# Patient Record
Sex: Male | Born: 1955 | Race: White | Hispanic: No
Health system: Midwestern US, Community
[De-identification: ages and names within clinical notes are randomized; demographics above are authoritative.]

## PROBLEM LIST (undated history)

## (undated) DIAGNOSIS — J449 Chronic obstructive pulmonary disease, unspecified: Principal | ICD-10-CM

## (undated) DIAGNOSIS — J432 Centrilobular emphysema: Principal | ICD-10-CM

## (undated) DIAGNOSIS — J441 Chronic obstructive pulmonary disease with (acute) exacerbation: Principal | ICD-10-CM

---

## 2010-05-19 LAB — CBC WITH AUTO DIFFERENTIAL
Absolute Baso #: 0 E9/L (ref 0.00–0.20)
Absolute Eos #: 0 E9/L — ABNORMAL LOW (ref 0.05–0.50)
Absolute Lymph #: 1.33 E9/L — ABNORMAL LOW (ref 1.50–4.00)
Absolute Mono #: 1.11 E9/L — ABNORMAL HIGH (ref 0.10–0.95)
Absolute Neut #: 19.76 E9/L — ABNORMAL HIGH (ref 1.80–7.30)
Band Neutrophils: 10 % (ref 0–12)
Basophils: 0 % (ref 0–2)
Eosinophils %: 0 % (ref 0–6)
HCT: 42.5 % (ref 37.0–54.0)
HGB: 14.2 g/dL (ref 12.5–16.5)
Lymphocytes: 6 % — ABNORMAL LOW (ref 20–42)
MCH: 33.6 pg (ref 26.0–35.0)
MCHC: 33.3 % (ref 32.0–34.5)
MCV: 100.8 fL — ABNORMAL HIGH (ref 80.0–99.9)
MPV: 8.1 fL (ref 7.0–12.0)
Monocytes: 5 % (ref 2–12)
Platelet Estimate: NORMAL
Platelets: 296 E9/L (ref 130–450)
RBC: 4.22 E12/L (ref 3.80–5.80)
RDW: 11.4 fL — ABNORMAL LOW (ref 11.5–15.0)
Seg Neutrophils: 79 % (ref 43–80)
WBC: 22.2 E9/L — ABNORMAL HIGH (ref 4.5–11.5)

## 2010-05-19 LAB — BASIC METABOLIC PANEL
BUN: 18 mg/dL (ref 6–20)
CO2: 35 mmol/L — ABNORMAL HIGH (ref 20–31)
Calcium: 8.8 mg/dL (ref 8.6–10.5)
Chloride: 99 mmol/L (ref 99–109)
Creatinine: 0.8 mg/dL (ref 0.7–1.3)
Glucose: 122 mg/dL — ABNORMAL HIGH (ref 70–110)
Potassium: 4.1 mmol/L (ref 3.5–5.5)
Sodium: 139 mmol/L (ref 132–146)

## 2010-05-19 LAB — GFR CALCULATED: Gfr Calculated: >60 mL/min/{1.73_m2} (ref >=60)

## 2010-05-20 LAB — CK-MB
CK-MB: 2.1 ng/mL (ref 0.0–5.0)
CK-MB: 0.9 ng/mL (ref 0.0–5.0)

## 2010-05-20 LAB — RESPIRATORY ABG RESULTS ONLY
AaDO2: 102.3 mmHg
B.E.: 1.9 mmol/L (ref <=3.0)
B.E.: 5.1 mmol/L — ABNORMAL HIGH (ref <=3.0)
B.E.: 4.2 mmol/L — ABNORMAL HIGH (ref <=3.0)
COHb: 1.5 % (ref 0.0–1.5)
COHb: 1.6 % — ABNORMAL HIGH (ref 0.0–1.5)
COHb: 1.5 % (ref 0.0–1.5)
Date Analyzed: 20110720
Date Analyzed: 20110720
Date Analyzed: 20110720
Date Of Collection: 20110720
Date Of Collection: 20110720
Date Of Collection: 20110720
FIO2: 100 %
FIO2: 50 %
HCO3: 31.4 mmol/L — ABNORMAL HIGH (ref 22.0–26.0)
HCO3: 31 mmol/L — ABNORMAL HIGH (ref 22.0–26.0)
HCO3: 31 mmol/L — ABNORMAL HIGH (ref 22.0–26.0)
HHb: 0.3 % (ref 0.0–5.0)
HHb: 0.3 % (ref 0.0–5.0)
HHb: 0.7 % (ref 0.0–5.0)
Lab: 10285
Lab: 10285
Lab: 10285
MetHb: 0.5 % (ref 0.0–1.5)
MetHb: 0.4 % (ref 0.0–1.5)
MetHb: 0.5 % (ref 0.0–1.5)
O2 Content: 19.3 mL/dL
O2 Content: 20 mL/dL
O2Hb: 97.7 % — ABNORMAL HIGH (ref 94.0–97.0)
O2Hb: 97.7 % — ABNORMAL HIGH (ref 94.0–97.0)
O2Hb: 97.3 % — ABNORMAL HIGH (ref 94.0–97.0)
Operator ID: 9
Operator ID: 9
Operator ID: 274
PCO2: 72.8 mmHg (ref 35.0–45.0)
PCO2: 50.5 mmHg — ABNORMAL HIGH (ref 35.0–45.0)
PCO2: 54.8 mmHg — ABNORMAL HIGH (ref 35.0–45.0)
PO2/FIO2: 3.75 mmHg/%
PO2: >550 mmHg (ref 60.0–100.0)
PO2: 187.4 mmHg — ABNORMAL HIGH (ref 60.0–100.0)
PO2: 134.8 mmHg — ABNORMAL HIGH (ref 60.0–100.0)
Peep/Cpap: 5 cmH2O
Peep/Cpap: 5 cmH2O
Peep/Cpap: 5 cmH2O
Pt Temp: 37 C
Pt Temp: 37 C
Pt Temp: 37 C
RI(T): 0.55
Rr Mechanical: 16 b/min
Rr Mechanical: 20 b/min
Rr Mechanical: 20 b/min
Time Analyzed: 304
Time Analyzed: 516
Time Analyzed: 1652
Time Collected: 300
Time Collected: 515
Time Collected: 1650
Vt Mechanical: 600 mL
Vt Mechanical: 600 mL
Vt Mechanical: 600 mL
pH: 7.252 — ABNORMAL LOW (ref 7.350–7.450)
pH: 7.406 (ref 7.350–7.450)
pH: 7.37 (ref 7.350–7.450)
sO2: 99.7 % — ABNORMAL HIGH (ref 92.0–98.5)
sO2: 99.7 % — ABNORMAL HIGH (ref 92.0–98.5)
sO2: 99.3 % — ABNORMAL HIGH (ref 92.0–98.5)
tHb (est): 14.5 g/dL (ref 11.5–16.5)
tHb (est): 13.8 g/dL (ref 11.5–16.5)
tHb (est): 14.5 g/dL (ref 11.5–16.5)

## 2010-05-20 LAB — BLOOD GAS, ARTERIAL
B.E.: 6 mmol/L — ABNORMAL HIGH (ref <=3.0)
B.E.: 1.7 mmol/L (ref <=3.0)
HCO3: 32.8 mmol/L — ABNORMAL HIGH (ref 22.0–26.0)
HCO3: 33.1 mmol/L — ABNORMAL HIGH (ref 22.0–26.0)
Inspiratory Hold: 0 s
Inspired O2: 3 %
Inspired O2: 7 %
O2 Sat: 91 % — ABNORMAL LOW (ref 95.0–100.0)
O2 Sat: 98.8 % (ref 95.0–100.0)
PCO2: 56.5 mmHg — ABNORMAL HIGH (ref 35.0–45.0)
PCO2: 88.6 mmHg (ref 35.0–45.0)
PO2: 63 mmHg (ref 60.0–80.0)
PO2: 138.7 mmHg — ABNORMAL HIGH (ref 60.0–80.0)
Paw: 0 cmH2O
Peep/Cpap: 0 cmH2O
Pressure Support: 0 cmH2O
Rr Mechanical: 0 {beats}/min
Vt Mechanical: 0 ml
pH, Arterial: 7.372 (ref 7.350–7.450)
pH, Arterial: 7.19 (ref 7.350–7.450)

## 2010-05-20 LAB — COMPREHENSIVE METABOLIC PANEL
ALT: 37 U/L (ref 10–49)
AST: 26 U/L (ref 0–33)
Albumin: 4.4 g/dL (ref 3.2–4.8)
Alkaline Phosphatase: 77 U/L (ref 45–129)
BUN: 16 mg/dL (ref 6–20)
Bilirubin, Total: 0.3 mg/dL (ref 0.3–1.2)
CO2: 31 mmol/L (ref 20–31)
Calcium: 9 mg/dL (ref 8.6–10.5)
Chloride: 100 mmol/L (ref 99–109)
Creatinine: 0.9 mg/dL (ref 0.7–1.3)
Glucose: 182 mg/dL — ABNORMAL HIGH (ref 70–110)
Potassium: 6 mmol/L — ABNORMAL HIGH (ref 3.5–5.5)
Sodium: 135 mmol/L (ref 132–146)
Total Protein: 7.3 g/dL (ref 5.7–8.2)

## 2010-05-20 LAB — TSH: TSH: 0.379 u[IU]/mL (ref 0.350–5.000)

## 2010-05-20 LAB — LIPID PANEL
Cholesterol: 153 mg/dL (ref 0–199)
HDL: 40 mg/dL — AB (ref >40.0)
LDL Calculated: 92 mg/dL (ref 0–99)
Triglycerides: 105 mg/dL (ref 0–149)

## 2010-05-20 LAB — CBC WITH AUTO DIFFERENTIAL
Absolute Baso #: 0 E9/L (ref 0.00–0.20)
Absolute Eos #: 0 E9/L — ABNORMAL LOW (ref 0.05–0.50)
Absolute Lymph #: 0.34 E9/L — ABNORMAL LOW (ref 1.50–4.00)
Absolute Mono #: 0.17 E9/L (ref 0.10–0.95)
Absolute Neut #: 11.39 E9/L — ABNORMAL HIGH (ref 1.80–7.30)
Basophils: 0 % (ref 0–2)
Eosinophils %: 0 % (ref 0–6)
HCT: 36.8 % — ABNORMAL LOW (ref 37.0–54.0)
HGB: 13.1 g/dL (ref 12.5–16.5)
Lymphocytes: 3 % — ABNORMAL LOW (ref 20–42)
MCH: 34.4 pg (ref 26.0–35.0)
MCHC: 35.5 % — ABNORMAL HIGH (ref 32.0–34.5)
MCV: 97 fL (ref 80.0–99.9)
MPV: 8.9 fL (ref 7.0–12.0)
Monocytes: 2 % (ref 2–12)
Platelet Estimate: NORMAL
Platelets: 242 E9/L (ref 130–450)
RBC: 3.8 E12/L (ref 3.80–5.80)
RDW: 13.6 fL (ref 11.5–15.0)
Seg Neutrophils: 96 % — ABNORMAL HIGH (ref 43–80)
WBC: 11.9 E9/L — ABNORMAL HIGH (ref 4.5–11.5)

## 2010-05-20 LAB — MULTI DRUG SCREEN, URINE
Amphetamines: POSITIVE ng/mL
Barbiturates: NOT DETECTED ng/mL (ref ?–200)
Benzodiazepines: POSITIVE ng/mL (ref ?–200)
Cannabinoids: NOT DETECTED ng/mL
Cocaine Metabolite: NOT DETECTED ng/mL (ref ?–300)
Methadone: NOT DETECTED ng/mL (ref ?–300)
Opiates: NOT DETECTED ng/mL (ref ?–300)
Phencyclidine: NOT DETECTED ng/mL
Propoxyphene: NOT DETECTED ng/mL (ref ?–300)

## 2010-05-20 LAB — MAGNESIUM: Magnesium: 2.1 mg/dL (ref 1.3–2.7)

## 2010-05-20 LAB — TROPONIN
Troponin I: 0.09 ng/mL (ref 0.00–0.40)
Troponin I: 0.03 ng/mL (ref 0.00–0.40)

## 2010-05-20 LAB — CK
Total CK: 126 U/L (ref 32–294)
Total CK: 78 U/L (ref 32–294)

## 2010-05-20 LAB — T3, UPTAKE: T3 Uptake: 40.5 % — ABNORMAL HIGH (ref 22.5–37.0)

## 2010-05-20 LAB — GFR CALCULATED: Gfr Calculated: >60 mL/min/{1.73_m2} (ref >=60)

## 2010-05-20 LAB — PHOSPHORUS: Phosphorus: 2.2 mg/dL — ABNORMAL LOW (ref 2.4–5.1)

## 2010-05-20 LAB — T4: T4, Total: 8 ug/dL (ref 4.5–10.9)

## 2010-05-21 LAB — CBC WITH AUTO DIFFERENTIAL
Absolute Baso #: 0 E9/L (ref 0.00–0.20)
Absolute Eos #: 0 E9/L — ABNORMAL LOW (ref 0.05–0.50)
Absolute Lymph #: 0.77 E9/L — ABNORMAL LOW (ref 1.50–4.00)
Absolute Mono #: 0.85 E9/L (ref 0.10–0.95)
Absolute Neut #: 8.28 E9/L — ABNORMAL HIGH (ref 1.80–7.30)
Basophils: 0 % (ref 0–2)
Eosinophils %: 0 % (ref 0–6)
HCT: 33.7 % — ABNORMAL LOW (ref 37.0–54.0)
HGB: 11.8 g/dL — ABNORMAL LOW (ref 12.5–16.5)
Lymphocytes: 8 % — ABNORMAL LOW (ref 20–42)
MCH: 34.4 pg (ref 26.0–35.0)
MCHC: 35.1 % — ABNORMAL HIGH (ref 32.0–34.5)
MCV: 97.9 fL (ref 80.0–99.9)
MPV: 8.9 fL (ref 7.0–12.0)
Monocytes: 9 % (ref 2–12)
Platelets: 275 E9/L (ref 130–450)
RBC: 3.44 E12/L — ABNORMAL LOW (ref 3.80–5.80)
RDW: 13.7 fL (ref 11.5–15.0)
Seg Neutrophils: 84 % — ABNORMAL HIGH (ref 43–80)
WBC: 9.9 E9/L (ref 4.5–11.5)

## 2010-05-21 LAB — RESPIRATORY ABG RESULTS ONLY
AaDO2: 75.3 mmHg
AaDO2: 77.4 mmHg
B.E.: 4.5 mmol/L — ABNORMAL HIGH (ref <=3.0)
B.E.: 6.7 mmol/L — ABNORMAL HIGH (ref <=3.0)
B.E.: 6.8 mmol/L — ABNORMAL HIGH (ref <=3.0)
COHb: 1.1 % (ref 0.0–1.5)
COHb: 1.1 % (ref 0.0–1.5)
COHb: 1.5 % (ref 0.0–1.5)
Date Analyzed: 20110721
Date Analyzed: 20110721
Date Analyzed: 20110721
Date Of Collection: 20110721
Date Of Collection: 20110721
Date Of Collection: 20110721
FIO2: 40 %
FIO2: 40 %
HCO3: 30.9 mmol/L — ABNORMAL HIGH (ref 22.0–26.0)
HCO3: 33.6 mmol/L — ABNORMAL HIGH (ref 22.0–26.0)
HCO3: 34.4 mmol/L — ABNORMAL HIGH (ref 22.0–26.0)
HHb: 0.8 % (ref 0.0–5.0)
HHb: 1 % (ref 0.0–5.0)
HHb: 3.9 % (ref 0.0–5.0)
Lab: 10285
Lab: 10285
Lab: 10285
MetHb: 0.5 % (ref 0.0–1.5)
MetHb: 0.5 % (ref 0.0–1.5)
MetHb: 0.5 % (ref 0.0–1.5)
O2 Content: 17.5 mL/dL
O2 Content: 17.8 mL/dL
O2 Content: 18.3 mL/dL
O2Hb: 94.1 % (ref 94.0–97.0)
O2Hb: 97.4 % — ABNORMAL HIGH (ref 94.0–97.0)
O2Hb: 97.6 % — ABNORMAL HIGH (ref 94.0–97.0)
Operator ID: 33
Operator ID: 9
Operator ID: 9097
PCO2: 54.5 mmHg — ABNORMAL HIGH (ref 35.0–45.0)
PCO2: 58.7 mmHg — ABNORMAL HIGH (ref 35.0–45.0)
PCO2: 62.8 mmHg — ABNORMAL HIGH (ref 35.0–45.0)
PO2/FIO2: 3.48 mmHg/%
PO2/FIO2: 3.31 mmHg/%
PO2: 132.3 mmHg — ABNORMAL HIGH (ref 60.0–100.0)
PO2: 139.2 mmHg — ABNORMAL HIGH (ref 60.0–100.0)
PO2: 78.7 mmHg (ref 60.0–100.0)
Peep/Cpap: 5 cmH2O
Peep/Cpap: 15 cmH2O
Pt Temp: 37 C
Pt Temp: 37 C
Pt Temp: 37 C
RI(T): 0.54
RI(T): 0.59
Rr Mechanical: 20 b/min
Time Analyzed: 1944
Time Analyzed: 521
Time Analyzed: 830
Time Collected: 1925
Time Collected: 515
Time Collected: 825
Vt Mechanical: 600 mL
pH: 7.356 (ref 7.350–7.450)
pH: 7.372 (ref 7.350–7.450)
pH: 7.376 (ref 7.350–7.450)
sO2: 96 % (ref 92.0–98.5)
sO2: 99 % — ABNORMAL HIGH (ref 92.0–98.5)
sO2: 99.2 % — ABNORMAL HIGH (ref 92.0–98.5)
tHb (est): 12.6 g/dL (ref 11.5–16.5)
tHb (est): 13.2 g/dL (ref 11.5–16.5)
tHb (est): 13.4 g/dL (ref 11.5–16.5)

## 2010-05-21 LAB — COMPREHENSIVE METABOLIC PANEL
ALT: 26 U/L (ref 10–49)
AST: 15 U/L (ref 0–33)
Albumin: 4 g/dL (ref 3.2–4.8)
Alkaline Phosphatase: 65 U/L (ref 45–129)
BUN: 23 mg/dL — ABNORMAL HIGH (ref 6–20)
Bilirubin, Total: 0.2 mg/dL — ABNORMAL LOW (ref 0.3–1.2)
CO2: 33 mmol/L — ABNORMAL HIGH (ref 20–31)
Calcium: 9.3 mg/dL (ref 8.6–10.5)
Chloride: 99 mmol/L (ref 99–109)
Creatinine: 0.7 mg/dL (ref 0.7–1.3)
Glucose: 175 mg/dL — ABNORMAL HIGH (ref 70–110)
Potassium: 4.7 mmol/L (ref 3.5–5.5)
Sodium: 135 mmol/L (ref 132–146)
Total Protein: 6.7 g/dL (ref 5.7–8.2)

## 2010-05-21 LAB — TLC CONFIRMATION

## 2010-05-21 LAB — CK-MB: CK-MB: <0.3 ng/mL (ref 0.0–5.0)

## 2010-05-21 LAB — TROPONIN: Troponin I: 0.02 ng/mL (ref 0.00–0.40)

## 2010-05-21 LAB — BENZODIAZEPINES CONFIRMATION URINE REFLEX

## 2010-05-21 LAB — CK: Total CK: 43 U/L (ref 32–294)

## 2010-05-21 LAB — GFR CALCULATED: Gfr Calculated: >60 mL/min/{1.73_m2} (ref >=60)

## 2010-05-22 LAB — CBC WITH AUTO DIFFERENTIAL
Absolute Baso #: 0 E9/L (ref 0.00–0.20)
Absolute Eos #: 0.14 E9/L (ref 0.05–0.50)
Absolute Lymph #: 0.74 E9/L — ABNORMAL LOW (ref 1.50–4.00)
Absolute Mono #: 0.46 E9/L (ref 0.10–0.95)
Absolute Neut #: 13.88 E9/L — ABNORMAL HIGH (ref 1.80–7.30)
Basophils: 0 % (ref 0–2)
Eosinophils %: 1 % (ref 0–6)
HCT: 36.1 % — ABNORMAL LOW (ref 37.0–54.0)
HGB: 12.6 g/dL (ref 12.5–16.5)
Lymphocytes: 5 % — ABNORMAL LOW (ref 20–42)
MCH: 34.1 pg (ref 26.0–35.0)
MCHC: 34.8 % — ABNORMAL HIGH (ref 32.0–34.5)
MCV: 98 fL (ref 80.0–99.9)
MPV: 8.7 fL (ref 7.0–12.0)
Monocytes: 3 % (ref 2–12)
Platelets: 317 E9/L (ref 130–450)
RBC: 3.68 E12/L — ABNORMAL LOW (ref 3.80–5.80)
RDW: 13.3 fL (ref 11.5–15.0)
Seg Neutrophils: 91 % — ABNORMAL HIGH (ref 43–80)
WBC: 15.2 E9/L — ABNORMAL HIGH (ref 4.5–11.5)

## 2010-05-22 LAB — COMPREHENSIVE METABOLIC PANEL
ALT: 90 U/L — ABNORMAL HIGH (ref 10–49)
AST: 62 U/L — ABNORMAL HIGH (ref 0–33)
Albumin: 4.1 g/dL (ref 3.2–4.8)
Alkaline Phosphatase: 66 U/L (ref 45–129)
BUN: 18 mg/dL (ref 6–20)
Bilirubin, Total: 0.4 mg/dL (ref 0.3–1.2)
CO2: 35 mmol/L — ABNORMAL HIGH (ref 20–31)
Calcium: 9 mg/dL (ref 8.6–10.5)
Chloride: 102 mmol/L (ref 99–109)
Creatinine: 0.5 mg/dL — ABNORMAL LOW (ref 0.7–1.3)
Glucose: 147 mg/dL — ABNORMAL HIGH (ref 70–110)
Potassium: 5.2 mmol/L (ref 3.5–5.5)
Sodium: 140 mmol/L (ref 132–146)
Total Protein: 6.6 g/dL (ref 5.7–8.2)

## 2010-05-22 LAB — GFR CALCULATED: Gfr Calculated: >60 mL/min/{1.73_m2} (ref >=60)

## 2010-05-22 LAB — HEMOGLOBIN A1C: Hemoglobin A1C: 3.9 % — ABNORMAL LOW (ref 4.8–6.0)

## 2010-05-23 LAB — CBC WITH AUTO DIFFERENTIAL
Absolute Baso #: 0.01 E9/L (ref 0.00–0.20)
Absolute Eos #: 0 E9/L — ABNORMAL LOW (ref 0.05–0.50)
Absolute Lymph #: 1.49 E9/L — ABNORMAL LOW (ref 1.50–4.00)
Absolute Mono #: 0.94 E9/L (ref 0.10–0.95)
Absolute Neut #: 11.42 E9/L — ABNORMAL HIGH (ref 1.80–7.30)
Basophils: 0 % (ref 0–2)
Eosinophils %: 0 % (ref 0–6)
HCT: 37.6 % (ref 37.0–54.0)
HGB: 13 g/dL (ref 12.5–16.5)
Lymphocytes: 11 % — ABNORMAL LOW (ref 20–42)
MCH: 33.8 pg (ref 26.0–35.0)
MCHC: 34.5 % (ref 32.0–34.5)
MCV: 97.7 fL (ref 80.0–99.9)
MPV: 8.2 fL (ref 7.0–12.0)
Monocytes: 7 % (ref 2–12)
Platelets: 348 E9/L (ref 130–450)
RBC: 3.85 E12/L (ref 3.80–5.80)
RDW: 13.5 fL (ref 11.5–15.0)
Seg Neutrophils: 82 % — ABNORMAL HIGH (ref 43–80)
WBC: 13.9 E9/L — ABNORMAL HIGH (ref 4.5–11.5)

## 2010-05-23 LAB — RESPIRATORY ABG RESULTS ONLY
B.E.: 9.4 mmol/L — ABNORMAL HIGH (ref <=3.0)
COHb: 2 % — ABNORMAL HIGH (ref 0.0–1.5)
Date Analyzed: 20110723
Date Of Collection: 20110723
HCO3: 34.7 mmol/L — ABNORMAL HIGH (ref 22.0–26.0)
HHb: 5.7 % — ABNORMAL HIGH (ref 0.0–5.0)
Lab: 10285
MetHb: 0.4 % (ref 0.0–1.5)
O2 Content: 19.1 mL/dL
O2Hb: 91.9 % — ABNORMAL LOW (ref 94.0–97.0)
Operator ID: 40
PCO2: 49.1 mmHg — ABNORMAL HIGH (ref 35.0–45.0)
PO2: 61.6 mmHg (ref 60.0–100.0)
Pt Temp: 37 C
Time Analyzed: 1326
Time Collected: 1320
pH: 7.467 — ABNORMAL HIGH (ref 7.350–7.450)
sO2: 94.2 % (ref 92.0–98.5)
tHb (est): 14.8 g/dL (ref 11.5–16.5)

## 2010-05-23 LAB — HEPATIC FUNCTION PANEL
ALT: 76 U/L — ABNORMAL HIGH (ref 10–49)
AST: 22 U/L (ref 0–33)
Albumin: 3.9 g/dL (ref 3.2–4.8)
Alkaline Phosphatase: 63 U/L (ref 45–129)
Bilirubin, Direct: 0.2 mg/dL (ref 0.0–0.2)
Bilirubin, Total: 0.6 mg/dL (ref 0.3–1.2)
Total Protein: 6.2 g/dL (ref 5.7–8.2)

## 2010-05-23 LAB — HEMOGLOBIN A1C: Hemoglobin A1C: 3.9 % — ABNORMAL LOW (ref 4.8–6.0)

## 2010-05-23 LAB — COMPREHENSIVE METABOLIC PANEL
BUN: 17 mg/dL (ref 6–20)
CO2: 37 mmol/L — ABNORMAL HIGH (ref 20–31)
Calcium: 9.3 mg/dL (ref 8.6–10.5)
Chloride: 99 mmol/L (ref 99–109)
Creatinine: 0.7 mg/dL (ref 0.7–1.3)
Glucose: 119 mg/dL — ABNORMAL HIGH (ref 70–110)
Potassium: 5.2 mmol/L (ref 3.5–5.5)
Sodium: 139 mmol/L (ref 132–146)

## 2010-05-23 LAB — GFR CALCULATED: Gfr Calculated: >60 mL/min/{1.73_m2} (ref >=60)

## 2010-05-24 LAB — THEOPHYLLINE LEVEL: Theophylline Lvl: 3.3 ug/mL — ABNORMAL LOW (ref 10.0–20.0)

## 2010-05-25 LAB — HEPATITIS PANEL, ACUTE
Hep A IgM: NONREACTIVE
Hep B Core Ab, IgM: NONREACTIVE
Hepatitis B Surface Ag: NONREACTIVE
Hepatitis C Ab: NONREACTIVE

## 2011-06-07 ENCOUNTER — Inpatient Hospital Stay: Admit: 2011-06-07 | Discharge: 2011-06-08 | Disposition: A | Discharge status: Other Acute Inpatient Hospital

## 2011-06-08 LAB — CK-MB: CK-MB: 3.4 ng/mL (ref 0.0–5.0)

## 2011-06-08 LAB — CBC WITH AUTO DIFFERENTIAL
Absolute Eos #: 0.1 E9/L (ref 0.05–0.50)
Absolute Lymph #: 4.3 E9/L — ABNORMAL HIGH (ref 1.50–4.00)
Absolute Mono #: 1.1 E9/L — ABNORMAL HIGH (ref 0.10–0.95)
Absolute Neut #: 10.7 E9/L — ABNORMAL HIGH (ref 1.80–7.30)
Basophils Absolute: 0.1 E9/L (ref 0.00–0.20)
Basophils: 0 % (ref 0–2)
Eosinophils %: 1 % (ref 0–6)
Hematocrit: 48.4 % (ref 37.0–54.0)
Hemoglobin: 17 g/dL — ABNORMAL HIGH (ref 12.5–16.5)
Lymphocytes: 27 % (ref 20–42)
MCH: 34.6 pg (ref 26.0–35.0)
MCHC: 35.2 % — ABNORMAL HIGH (ref 32.0–34.5)
MCV: 98.4 fL (ref 80.0–99.9)
MPV: 8.5 fL (ref 7.0–12.0)
Monocytes: 7 % (ref 2–12)
Platelets: 318 E9/L (ref 130–450)
RBC: 4.91 E12/L (ref 3.80–5.80)
RDW: 13.5 fL (ref 11.5–15.0)
Seg Neutrophils: 65 % (ref 43–80)
WBC: 16.3 E9/L — ABNORMAL HIGH (ref 4.5–11.5)

## 2011-06-08 LAB — COMPREHENSIVE METABOLIC PANEL
ALT: 38 U/L (ref 10–49)
AST: 26 U/L (ref 0–33)
Albumin: 4.9 g/dL — ABNORMAL HIGH (ref 3.2–4.8)
Alkaline Phosphatase: 88 U/L (ref 45–129)
BUN: 10 mg/dL (ref 6–20)
CO2: 27 mmol/L (ref 20–31)
Calcium: 9.5 mg/dL (ref 8.6–10.5)
Chloride: 105 mmol/L (ref 99–109)
Creatinine: 0.8 mg/dL (ref 0.7–1.3)
Glucose: 116 mg/dL — ABNORMAL HIGH (ref 70–110)
Potassium: 4.3 mmol/L (ref 3.5–5.5)
Sodium: 141 mmol/L (ref 132–146)
Total Bilirubin: 0.6 mg/dL (ref 0.3–1.2)
Total Protein: 7.1 g/dL (ref 5.7–8.2)

## 2011-06-08 LAB — ETHANOL: Ethanol Lvl: 214 mg/dL

## 2011-06-08 LAB — TROPONIN: Troponin I: 0 ng/mL (ref 0.00–0.40)

## 2011-06-08 LAB — CK: Total CK: 113 U/L (ref 32–294)

## 2011-06-08 LAB — GFR CALCULATED: Gfr Calculated: >60 mL/min/{1.73_m2} (ref >=60)

## 2011-06-09 MED FILL — MORPHINE SULFATE 4 MG/ML IJ SOLN: 4 MG/ML | INTRAMUSCULAR | Qty: 1

## 2011-06-09 MED FILL — AMIDATE 2 MG/ML IV SOLN: 2 MG/ML | INTRAVENOUS | Qty: 10

## 2011-06-09 MED FILL — MIDAZOLAM HCL 2 MG/2ML IJ SOLN: 2 MG/ML | INTRAMUSCULAR | Qty: 2

## 2011-06-09 MED FILL — QUELICIN 20 MG/ML IJ SOLN: 20 MG/ML | INTRAMUSCULAR | Qty: 10

## 2011-06-09 MED FILL — SODIUM CHLORIDE 0.9 % IV SOLN: 0.9 % | INTRAVENOUS | Qty: 1000

## 2011-06-09 MED FILL — FENTANYL CITRATE 0.05 MG/ML IJ SOLN: 0.05 MG/ML | INTRAMUSCULAR | Qty: 2

## 2011-06-09 MED FILL — DIPRIVAN 10 MG/ML IV EMUL: 10 MG/ML | INTRAVENOUS | Qty: 50

## 2011-06-09 MED FILL — VECURONIUM BROMIDE 10 MG IV SOLR: 10 MG | INTRAVENOUS | Qty: 10

## 2012-04-09 NOTE — ED Provider Notes (Signed)
Patient is a 56 y.o. male presenting with pharyngitis. The history is provided by the patient [facility] in complaining of his throat closing, treated emergently with Solumedrol, Benadryl, and Pepcid.  Patient then reports a sudden onset of swelling on the right side of his neck and his throat hurts now).   Pharyngitis  This is a new (patient also complains of have bad reflux and belching today) problem. The current episode started less than 1 hour ago. The problem has been gradually worsening. Associated symptoms include abdominal pain (with reflux and belching). Pertinent negatives include no chest pain, no headaches and no shortness of breath.       Review of Systems   Constitutional: Negative for fever and chills.   HENT: Positive for sore throat and trouble swallowing. Negative for ear pain and sinus pressure.    Eyes: Negative for pain, discharge and redness.   Respiratory: Negative for cough, shortness of breath and wheezing.    Cardiovascular: Negative for chest pain.   Gastrointestinal: Positive for abdominal pain (with reflux and belching). Negative for nausea, vomiting and diarrhea.   Genitourinary: Negative for dysuria and frequency.   Musculoskeletal: Negative for back pain and arthralgias.   Skin: Negative for rash and wound.   Neurological: Negative for weakness and headaches.   Hematological: Negative for adenopathy.   All other systems reviewed and are negative.        Physical Exam   Nursing note and vitals reviewed.  Constitutional: He is oriented to person, place, and time. He appears well-developed and well-nourished.   HENT:   Head: Normocephalic and atraumatic.   Eyes: Pupils are equal, round, and reactive to light.   Neck: Normal range of motion. Neck supple.       Large tender mass palpated   Cardiovascular: Normal rate, regular rhythm and normal heart sounds.    No murmur heard.  Pulmonary/Chest: Effort normal and breath sounds normal. No respiratory distress. He has no wheezes. He has no  rales.   Abdominal: Soft. Bowel sounds are normal. There is no tenderness. There is no rebound and no guarding.   Musculoskeletal: He exhibits no edema.   Neurological: He is alert and oriented to person, place, and time. No cranial nerve deficit. Coordination normal.   Skin: Skin is warm and dry.       Procedures    MDM    Labs      Radiology      EKG Interpretation.    --------------------------------------------- PAST HISTORY ---------------------------------------------  Past Medical History:  has a past medical history of COPD (chronic obstructive pulmonary disease).    Past Surgical History:  has past surgical history that includes Appendectomy and Tonsillectomy.    Social History:  reports that he has quit smoking. He does not have any smokeless tobacco history on file. He reports that he does not drink alcohol or use illicit drugs.    Family History: family history is not on file.     The patient???s home medications have been reviewed.    Allergies: Bee    -------------------------------------------------- RESULTS -------------------------------------------------    LABS:  Results for orders placed during the hospital encounter of [id number]   BASIC METABOLIC PANEL       Result Value Range    Sodium 140  132 - 146 mmol/L    Potassium 3.6  3.5 - 5.5 mmol/L    Chloride 104  99 - 109 mmol/L    CO2 34 (*) 20 - 31 mmol/L  Glucose 111 (*) 70 - 110 mg/dL    BUN 14  6 - 20 mg/dL    Creatinine, Ser 0.8  0.7 - 1.3 mg/dL    Calcium 8.9  8.6 - 10.5 mg/dL   CBC WITH AUTO DIFFERENTIAL       Result Value Range    WBC 11.0  4.5 - 11.5 E9/L    RBC 4.02  3.80 - 5.80 E12/L    Hemoglobin 13.3  12.5 - 16.5 g/dL    Hematocrit 39.3  37.0 - 54.0 %    MCV 97.6  80.0 - 99.9 fL    MCH 33.1  26.0 - 35.0 pg    MCHC 33.9  32.0 - 34.5 %    RDW 13.7  11.5 - 15.0 fL    Platelets 267  130 - 450 E9/L    MPV 8.5  7.0 - 12.0 fL    Absolute Neut # 6.60  1.80 - 7.30 E9/L    Absolute Lymph # 2.90  1.50 - 4.00 E9/L    Absolute Mono # 1.20 (*) 0.10 -  0.95 E9/L    Absolute Eos # 0.20  0.05 - 0.50 E9/L    Basophils Absolute 0.10  0.00 - 0.20 E9/L    Seg Neutrophils 60  43 - 80 %    Lymphocytes 27  20 - 42 %    Monocytes 11  2 - 12 %    Eosinophils 2  0 - 6 %    Basophils 1  0 - 2 %   CK       Result Value Range    Total CK 45  32 - 294 U/L   CK-MB       Result Value Range    CK-MB 0.7  0.0 - 5.0 ng/mL   TROPONIN I       Result Value Range    Troponin I 0.00  0.00 - 0.04 ng/mL   GFR CALCULATED       Result Value Range    Gfr Calculated >60  >=60 ml/mn/1.73       RADIOLOGY:  CT SOFT TISSUE NECK W CONTRAST    Final Result:            EKG:  This EKG is signed and interpreted by me.    Rate: 79  Rhythm: Sinus  Interpretation: no acute changes  Comparison: no previous EKG available      ------------------------- NURSING NOTES AND VITALS REVIEWED ---------------------------   The nursing notes within the ED encounter and vital signs as below have been reviewed.   BP 149/95   Pulse 84   Temp(Src) 97.8 ??F (36.6 ??C) (Oral)   Resp 16   Ht 6' 2"$  (1.88 m)   Wt 200 lb (90.719 kg)   BMI 25.67 kg/m2   SpO2 99%  Oxygen Saturation Interpretation: Normal      ------------------------------------------ PROGRESS NOTES ------------------------------------------     Consultations:      Counseling:   I have spoken with the patient and discussed today???s results, in addition to providing specific details for the plan of care and counseling regarding the diagnosis and prognosis.  Their questions are answered at this time and they are agreeable with the plan.      --------------------------------- ADDITIONAL PROVIDER NOTES ---------------------------------     Discussed diagnosis.  Copy of CT scan report given to patient, as well as information printed from the internet.      This patient's ED course included: a  personal history and physicial eaxmination, re-evaluation prior to disposition, multiple bedside re-evaluations and IV medications    This patient has remained hemodynamically stable  and improved during their ED course.    Critical Care:         Isaias Cowman, DO  04/09/12 2331

## 2012-04-09 NOTE — Discharge Instructions (Signed)
Sialadenitis  Sialadenitis is an inflammation (soreness) of the salivary glands. The parotid is the main salivary gland. It lies behind the angle of the jaw below the ear. The saliva produced comes out of a tiny opening (duct) inside the cheek on either side. This is usually at the level of the upper back teeth. If it is swollen, the ear is pushed up and out. This helps tell this condition apart from a simple lymph gland infection (swollen glands) in the same area. Mumps has mostly disappeared since the start of immunization against mumps. Now the most common cause of parotitis is germ (bacterial) infection or inflammation of the lymphatics (the lymph channels). The other major salivary gland is located in the floor of the mouth. Smaller salivary glands are located in the mouth. This includes the:   Lips.   Lining of the mouth.   Pharynx.   Hard palate (front part of the roof of the mouth).  The salivary glands do many things, including:   Lubrication.   Breaking down food.   Production of hormones and antibodies (to protect against germs which may cause illness).   Help with the sense of taste.  ACUTE BACTERIAL SIALADENITIS  This is a sudden inflammatory response to bacterial infection. This causes redness, pain, swelling and tenderness over the infected gland. In the past, it was common in dehydrated and debilitated patients often following an operation. It is now more commonly seen:   After radiotherapy.   In patients with poor immune systems.  Treatment is:   The correction of fluid balance (rehydration).   Medicine that kill germs (antibiotics).   Pain relief.  CHRONIC RECURRENT SIALADENITIS  This refers to repeated episodes of discomfort and swelling of one of the salivary glands. It often occurs after eating. Chronic sialadenitis is usually less painful. It is associated with recurrent enlargement of a salivary gland, often following meals, and typically with an absence of redness. The  chronic form of the disease often is associated with conditions linked to decreased salivary flow, rather than dehydration (loss of body fluids). These conditions include:   A stone, or concretion, formed in the gallbladder, kidneys, or other parts of the body (calculi).   Salivary stasis.   A change in the fluid and electrolyte (the salts in your body fluids) makeup of the gland.  It is treated with:   Gland massage.   Methods to stimulate the flow of saliva, (for example, lemon juice).   Antibiotics if required.  Surgery to remove the gland is possible, but its benefits need to be balanced against risks.    VIRAL SIALADENITIS  Several viruses infect the salivary glands. Some of these include the mumps virus that commonly infects the parotid gland. Other viruses causing problems are:   The HIV virus.   Herpes.   Some of the influenza ("flu") viruses.  RECURRENT SIALADENITIS IN CHILDREN  This condition is thought to be due to swelling or ballooning of the ducts. It results in the same symptoms as acute bacterial parotitis. It is usually caused by germs (bacteria). It is often treated using penicillin. It may get well without treatment. Surgery is usually not required.  TUBERCULOUS SIALADENITIS  The salivary glands may become infected with the same bacteria causing tuberculosis ("TB"). Treatment is with anti-tuberculous antibiotic therapy.  OTHER UNCOMMON CAUSES OF SIALADENITIS     Sjogren's syndrome is a condition in which arthritis is associated with a decrease in activity of the glands of the body that  produce saliva and tears. The diagnosis is made with blood tests or by examination of a piece of tissue from the inside of the lip. Some people with this condition are bothered by:   A dry mouth.   Intermittent salivary gland enlargement.   Atypical mycobacteria is a germ similar to tuberculosis. It often infects children. It is often resistant to antibiotic treatment. It may require surgical treatment  to remove the infected salivary gland.   Actinomycosis is an infection of the parotid gland that may also involve the overlying skin. The diagnosis is made by detecting granules of sulphur produced by the bacteria on microscopic examination. Treatment is a prolonged course of penicillin for up to one year.   Nutritional causes include vitamin deficiencies and bulimia.   Diabetes and problems with your thyroid.   Obesity, cirrhosis, and malabsorption are some metabolic causes.  HOME CARE INSTRUCTIONS     Apply ice bags every 2 hours for 15 to 20 minutes, while awake, to the sore gland for 24 hours, then as directed by your caregiver. Place the ice in a plastic bag with a towel around it to prevent frostbite to the skin.   Only take over-the-counter or prescription medicines for pain, discomfort, or fever as directed by your caregiver.  SEEK IMMEDIATE MEDICAL CARE IF:     There is increased pain or swelling in your gland that is not controlled with medicine.   An oral temperature above 102 F (38.9 C) develops, not controlled by medicine.   You develop difficulty opening your mouth, swallowing, or speaking.  Document Released: 04/09/2002 Document Revised: 10/07/2011 Document Reviewed: 06/03/2008  [facility] Patient Information 2012 [employer].

## 2012-04-09 NOTE — ED Notes (Signed)
"  feeling better", monitoring ongoing.    Rodena Goldmann, RN  04/09/12 2239

## 2012-04-09 NOTE — ED Notes (Signed)
Return from CT.    Rodena Goldmann, RN  04/09/12 [phone]

## 2012-04-09 NOTE — ED Notes (Signed)
Stated "throat was feeling like it was swelling shut" unknown exposure or trigger.  Speech clear, unlabored.  "hurts to swallow".    Rodena Goldmann, RN  04/09/12 2214

## 2012-04-09 NOTE — ED Notes (Signed)
Dr speaking with pt.    Rodena Goldmann, RN  04/09/12 2256

## 2012-04-10 ENCOUNTER — Inpatient Hospital Stay
Admit: 2012-04-10 | Discharge: 2012-04-10 | Disposition: A | Discharge status: Home / Self Care | Attending: Emergency Medicine

## 2012-04-10 LAB — CBC WITH AUTO DIFFERENTIAL
Absolute Eos #: 0.2 E9/L (ref 0.05–0.50)
Absolute Lymph #: 2.9 E9/L (ref 1.50–4.00)
Absolute Mono #: 1.2 E9/L — ABNORMAL HIGH (ref 0.10–0.95)
Absolute Neut #: 6.6 E9/L (ref 1.80–7.30)
Basophils Absolute: 0.1 E9/L (ref 0.00–0.20)
Basophils: 1 % (ref 0–2)
Eosinophils %: 2 % (ref 0–6)
Hematocrit: 39.3 % (ref 37.0–54.0)
Hemoglobin: 13.3 g/dL (ref 12.5–16.5)
Lymphocytes: 27 % (ref 20–42)
MCH: 33.1 pg (ref 26.0–35.0)
MCHC: 33.9 % (ref 32.0–34.5)
MCV: 97.6 fL (ref 80.0–99.9)
MPV: 8.5 fL (ref 7.0–12.0)
Monocytes: 11 % (ref 2–12)
Platelets: 267 E9/L (ref 130–450)
RBC: 4.02 E12/L (ref 3.80–5.80)
RDW: 13.7 fL (ref 11.5–15.0)
Seg Neutrophils: 60 % (ref 43–80)
WBC: 11 E9/L (ref 4.5–11.5)

## 2012-04-10 LAB — BASIC METABOLIC PANEL
BUN: 14 mg/dL (ref 6–20)
CO2: 34 mmol/L — ABNORMAL HIGH (ref 20–31)
Calcium: 8.9 mg/dL (ref 8.6–10.5)
Chloride: 104 mmol/L (ref 99–109)
Creatinine: 0.8 mg/dL (ref 0.7–1.3)
Glucose: 111 mg/dL — ABNORMAL HIGH (ref 70–110)
Potassium: 3.6 mmol/L (ref 3.5–5.5)
Sodium: 140 mmol/L (ref 132–146)

## 2012-04-10 LAB — TROPONIN: Troponin I: 0 ng/mL (ref 0.00–0.04)

## 2012-04-10 LAB — GFR CALCULATED: Gfr Calculated: >60 mL/min/{1.73_m2} (ref >=60)

## 2012-04-10 LAB — CK: Total CK: 45 U/L (ref 32–294)

## 2012-04-10 LAB — CK-MB: CK-MB: 0.7 ng/mL (ref 0.0–5.0)

## 2012-04-10 MED ORDER — CLINDAMYCIN HCL 300 MG PO CAPS
300 MG | ORAL_CAPSULE | Freq: Four times a day (QID) | ORAL | Status: AC
Start: 2012-04-10 — End: 2012-04-19

## 2012-04-10 MED ORDER — NAPROXEN 500 MG PO TABS
500 MG | ORAL_TABLET | Freq: Three times a day (TID) | ORAL | Status: DC
Start: 2012-04-10 — End: 2013-09-13

## 2012-04-10 MED ORDER — HYDROCODONE-ACETAMINOPHEN 5-325 MG PO TABS
5-325 MG | ORAL_TABLET | Freq: Four times a day (QID) | ORAL | Status: AC | PRN
Start: 2012-04-10 — End: 2012-04-16

## 2012-04-10 MED ORDER — DIPHENHYDRAMINE HCL 50 MG/ML IJ SOLN
50 MG/ML | Freq: Once | INTRAMUSCULAR | Status: AC
Start: 2012-04-10 — End: 2012-04-09

## 2012-04-10 MED ADMIN — ketorolac (TORADOL) injection 30 mg: INTRAVENOUS | @ 03:00:00 | NDC 64679075801

## 2012-04-10 MED ADMIN — Morphine Sulfate (PF) injection 2 mg: INTRAVENOUS | @ 03:00:00 | NDC 00409189001

## 2012-04-10 MED ADMIN — diphenhydrAMINE (BENADRYL) 50 MG/ML injection: INTRAVENOUS | @ 02:00:00 | NDC 00641037621

## 2012-04-10 MED ADMIN — ioversol (OPTIRAY) 68 % injection 100 mL: INTRAVENOUS | @ 02:00:00 | NDC 00019132311

## 2012-04-10 MED ADMIN — methylPREDNISolone sodium (SOLU-MEDROL) 125 MG injection: @ 02:00:00 | NDC 00009004725

## 2012-04-10 MED ADMIN — clindamycin (CLEOCIN) 600 mg in dextrose 5 % 50 mL IVPB: INTRAVENOUS | @ 03:00:00 | NDC 00009337501

## 2012-04-10 MED ADMIN — famotidine (PEPCID) 10 MG/ML injection: @ 02:00:00 | NDC 00069012101

## 2012-04-10 MED FILL — DIPHENHYDRAMINE HCL 50 MG/ML IJ SOLN: 50 MG/ML | INTRAMUSCULAR | Qty: 1

## 2012-04-10 MED FILL — METHYLPREDNISOLONE SODIUM SUCC 125 MG IJ SOLR: 125 MG | INTRAMUSCULAR | Qty: 125

## 2012-04-10 MED FILL — CLEOCIN IN D5W 600 MG/50ML IV SOLN: 600 MG/50ML | INTRAVENOUS | Qty: 50

## 2012-04-10 MED FILL — FAMOTIDINE 10 MG/ML IV SOLN: 10 MG/ML | INTRAVENOUS | Qty: 2

## 2012-04-10 MED FILL — MORPHINE SULFATE (PF) 2 MG/ML IV SOLN: 2 mg/mL | INTRAVENOUS | Qty: 1

## 2012-04-10 MED FILL — KETOROLAC TROMETHAMINE 30 MG/ML IJ SOLN: 30 MG/ML | INTRAMUSCULAR | Qty: 1

## 2012-05-01 LAB — EKG 12-LEAD

## 2013-09-09 ENCOUNTER — Inpatient Hospital Stay: Admit: 2013-09-09 | Source: Home / Self Care | Admitting: Internal Medicine

## 2013-09-09 LAB — CBC WITH AUTO DIFFERENTIAL
Basophils %: 0 % (ref 0–2)
Basophils Absolute: 0 E9/L (ref 0.00–0.20)
Eosinophils %: 0 % (ref 0–6)
Eosinophils Absolute: 0 E9/L — ABNORMAL LOW (ref 0.05–0.50)
Hematocrit: 46.2 % (ref 37.0–54.0)
Hemoglobin: 15.8 g/dL (ref 12.5–16.5)
Lymphocytes %: 10 % — ABNORMAL LOW (ref 20–42)
Lymphocytes Absolute: 2.65 E9/L (ref 1.50–4.00)
MCH: 33.1 pg (ref 26.0–35.0)
MCHC: 34.2 % (ref 32.0–34.5)
MCV: 96.6 fL (ref 80.0–99.9)
MPV: 8.6 fL (ref 7.0–12.0)
Monocytes %: 3 % (ref 2–12)
Monocytes Absolute: 0.8 E9/L (ref 0.10–0.95)
Neutrophils %: 87 % — ABNORMAL HIGH (ref 43–80)
Neutrophils Absolute: 23.06 E9/L — ABNORMAL HIGH (ref 1.80–7.30)
PLATELET SLIDE REVIEW: NORMAL
Platelets: 295 E9/L (ref 130–450)
RBC Morphology: NORMAL
RBC: 4.78 E12/L (ref 3.80–5.80)
RDW: 14.8 fL (ref 11.5–15.0)
WBC: 26.5 E9/L — ABNORMAL HIGH (ref 4.5–11.5)

## 2013-09-09 LAB — COMPREHENSIVE METABOLIC PANEL
ALT: 11 U/L (ref 0–40)
AST: 11 U/L (ref 0–39)
Albumin: 4.2 g/dL (ref 3.5–5.2)
Alkaline Phosphatase: 64 U/L (ref 40–129)
BUN: 12 mg/dL (ref 6–20)
CO2: 28 mmol/L (ref 22–29)
Calcium: 9.3 mg/dL (ref 8.6–10.2)
Chloride: 96 mmol/L — ABNORMAL LOW (ref 98–107)
Creatinine: 0.8 mg/dL (ref 0.7–1.2)
GFR African American: >60
GFR Non-African American: >60 mL/min/{1.73_m2} (ref >=60)
Glucose: 123 mg/dL — ABNORMAL HIGH (ref 74–109)
Potassium: 5.2 mmol/L — ABNORMAL HIGH (ref 3.5–5.0)
Sodium: 135 mmol/L (ref 132–146)
Total Bilirubin: 2.1 mg/dL — ABNORMAL HIGH (ref 0.0–1.2)
Total Protein: 7 g/dL (ref 6.4–8.3)

## 2013-09-09 LAB — TROPONIN
Troponin: <0.01 ng/mL (ref 0.00–0.03)
Troponin: <0.01 ng/mL (ref 0.00–0.03)

## 2013-09-09 LAB — PHOSPHORUS: Phosphorus: 3 mg/dL (ref 2.5–4.5)

## 2013-09-09 LAB — CK
Total CK: 22 U/L (ref 20–200)
Total CK: 21 U/L (ref 20–200)

## 2013-09-09 LAB — CK-MB
CK-MB: 1 ng/mL (ref 0.0–6.7)
CK-MB: 1.4 ng/mL (ref 0.0–6.7)

## 2013-09-09 LAB — LACTIC ACID: Lactic Acid: 5.5 mmol/L (ref 0.5–2.2)

## 2013-09-09 LAB — MAGNESIUM: Magnesium: 2.1 mg/dL (ref 1.6–2.6)

## 2013-09-09 LAB — THEOPHYLLINE LEVEL: Theophylline Lvl: <0.8 ug/mL — ABNORMAL LOW (ref 10.0–20.0)

## 2013-09-09 MED ORDER — ACETAMINOPHEN 325 MG PO TABS
325 | ORAL | Status: DC | PRN
Start: 2013-09-09 — End: 2013-09-13
  Administered 2013-09-10 – 2013-09-12 (×5): 650 mg via ORAL

## 2013-09-09 MED ADMIN — methylPREDNISolone sodium (SOLU-MEDROL) injection 125 mg: INTRAVENOUS | @ 14:00:00 | NDC 00009004725

## 2013-09-09 MED ADMIN — 0.9 % sodium chloride infusion: INTRAVENOUS | @ 14:00:00 | NDC 00338004904

## 2013-09-09 MED ADMIN — budesonide (PULMICORT) nebulizer suspension 250 mcg: RESPIRATORY_TRACT | @ 15:00:00 | NDC 00093681573

## 2013-09-09 MED ADMIN — ipratropium-albuterol (DUONEB) nebulizer solution 3 ampule: RESPIRATORY_TRACT | @ 08:00:00 | NDC 00487020101

## 2013-09-09 MED ADMIN — levofloxacin (LEVAQUIN) 500 MG/100ML infusion: INTRAVENOUS | @ 09:00:00 | NDC 00143972101

## 2013-09-09 MED ADMIN — ioversol (OPTIRAY) 68 % injection 100 mL: INTRAVENOUS | @ 09:00:00 | NDC 00019132311

## 2013-09-09 MED ADMIN — ipratropium-albuterol (DUONEB) nebulizer solution 1 ampule: RESPIRATORY_TRACT | @ 23:00:00 | NDC 00487020101

## 2013-09-09 MED ADMIN — budesonide (PULMICORT) nebulizer suspension 250 mcg: RESPIRATORY_TRACT | @ 23:00:00 | NDC 00093681573

## 2013-09-09 MED ADMIN — ipratropium-albuterol (DUONEB) nebulizer solution 1 ampule: RESPIRATORY_TRACT | @ 15:00:00 | NDC 00487020101

## 2013-09-09 MED ADMIN — ipratropium-albuterol (DUONEB) nebulizer solution 1 ampule: RESPIRATORY_TRACT | @ 19:00:00 | NDC 00487020101

## 2013-09-09 MED ADMIN — methylPREDNISolone sodium (SOLU-MEDROL) injection 125 mg: INTRAVENOUS | @ 08:00:00 | NDC 00009004725

## 2013-09-09 MED ADMIN — guaiFENesin (MUCINEX) SR tablet 600 mg: ORAL | @ 20:00:00 | NDC 63824000850

## 2013-09-09 MED ADMIN — enoxaparin (LOVENOX) injection 40 mg: SUBCUTANEOUS | @ 20:00:00 | NDC 00075801401

## 2013-09-09 MED ADMIN — methylPREDNISolone sodium (SOLU-MEDROL) injection 125 mg: INTRAVENOUS | @ 20:00:00 | NDC 00009004725

## 2013-09-09 MED FILL — MUCINEX 600 MG PO TB12: 600 MG | ORAL | Qty: 1

## 2013-09-09 MED FILL — IPRATROPIUM-ALBUTEROL 0.5-2.5 (3) MG/3ML IN SOLN: RESPIRATORY_TRACT | Qty: 3

## 2013-09-09 MED FILL — LEVAQUIN 500 MG/100ML IV SOLN: 500 MG/100ML | INTRAVENOUS | Qty: 100

## 2013-09-09 MED FILL — LOVENOX 40 MG/0.4ML SC SOLN: 40 MG/0.4ML | SUBCUTANEOUS | Qty: 0.4

## 2013-09-09 MED FILL — METHYLPREDNISOLONE SODIUM SUCC 125 MG IJ SOLR: 125 MG | INTRAMUSCULAR | Qty: 125

## 2013-09-09 MED FILL — PERFOROMIST 20 MCG/2ML IN NEBU: 20 MCG/2ML | RESPIRATORY_TRACT | Qty: 2

## 2013-09-09 MED FILL — BUDESONIDE 0.25 MG/2ML IN SUSP: 0.25 MG/2ML | RESPIRATORY_TRACT | Qty: 2

## 2013-09-09 MED FILL — THEOPHYLLINE ER 300 MG PO TB12: 300 MG | ORAL | Qty: 1

## 2013-09-09 NOTE — ED Notes (Addendum)
Levaquin infusion complete. Patient resting with eyes closed with easy non-labored respirations. Wife at the bedside.    Julian Hy, RN  09/09/13 0516    Julian Hy, RN  09/09/13 [phone]

## 2013-09-09 NOTE — ED Notes (Signed)
Breakfast tray given at this time    Marella Chimes, RN  09/09/13 [phone]

## 2013-09-09 NOTE — Progress Notes (Signed)
Welcome packet was given to patient. Bruce Aguirre 09/09/2013 9:29 AM

## 2013-09-09 NOTE — ED Notes (Signed)
Tray ordered    Bruce Aguirre  09/09/13 0730

## 2013-09-09 NOTE — ED Provider Notes (Signed)
HPI Comments: Patient with history of COPD patient comes in complaining of severe shortness of breath starting at 1700 with cough and congestion. Patient states it started suddenly. Patient only able to get out 2-3 word sentences. Patient states no fevers or chills. Patient states he does have some mild to moderate chest pain rated at 5 out of 10 across his chest. This is in combination with his shortness of breath.  Patient initially 89% on room air with a respiratory rate of 28.    Patient is a 57 y.o. male presenting with shortness of breath. The history is provided by the patient.   Shortness of Breath  Severity:  Severe  Onset quality:  Sudden  Duration: 1700.  Timing:  Constant  Progression:  Worsening  Chronicity:  New  Relieved by:  Nothing  Worsened by:  Exertion  Ineffective treatments:  None tried  Associated symptoms: cough and wheezing    Associated symptoms: no abdominal pain, no chest pain, no diaphoresis, no ear pain, no fever, no headaches, no rash, no sore throat, no swollen glands and no vomiting        Review of Systems   Constitutional: Negative for fever, chills and diaphoresis.   HENT: Positive for congestion. Negative for ear pain, sore throat, rhinorrhea and sinus pressure.    Eyes: Negative.    Respiratory: Positive for cough, shortness of breath and wheezing.    Cardiovascular: Negative for chest pain.   Gastrointestinal: Negative for nausea, vomiting, abdominal pain, diarrhea and constipation.   Genitourinary: Negative for dysuria, urgency and frequency.   Musculoskeletal: Negative for back pain and arthralgias.   Skin: Negative for rash and wound.   Neurological: Negative for weakness and headaches.   Hematological: Negative for adenopathy.   All other systems reviewed and are negative.        Physical Exam   Constitutional: He is oriented to person, place, and time. He appears well-developed and well-nourished.  Non-toxic appearance. No distress.   HENT:   Head: Normocephalic and  atraumatic.   Nose: Nose normal.   Mouth/Throat: Uvula is midline, oropharynx is clear and moist and mucous membranes are normal.   Eyes: Conjunctivae and EOM are normal. Pupils are equal, round, and reactive to light.   Neck: Normal range of motion. Neck supple.   Cardiovascular: Regular rhythm, S1 normal, S2 normal and normal heart sounds.  Tachycardia present.  Exam reveals no gallop and no friction rub.    No murmur heard.  Pulmonary/Chest: Accessory muscle usage present. Tachypnea noted. He is in respiratory distress. He has wheezes in the right upper field, the right middle field, the right lower field, the left upper field, the left middle field and the left lower field. He has no rhonchi. He has no rales. He exhibits no tenderness.   Abdominal: Soft. Normal appearance and bowel sounds are normal. There is no tenderness. There is no rebound and no guarding.   Musculoskeletal: Normal range of motion. He exhibits no edema.   Neurological: He is alert and oriented to person, place, and time. He has normal strength. No sensory deficit.   Skin: Skin is warm, dry and intact. No rash noted. He is not diaphoretic. No erythema.   Nursing note and vitals reviewed.      Procedures    MDM  --------------------------------------------- PAST HISTORY ---------------------------------------------  Past Medical History:  has a past medical history of COPD (chronic obstructive pulmonary disease) (HCC) and Asthma.    Past Surgical History:  has  past surgical history that includes Appendectomy and Tonsillectomy.    Social History:  reports that he has quit smoking. He does not have any smokeless tobacco history on file. He reports that he does not drink alcohol or use illicit drugs.    Family History: family history is not on file.     The patient???s home medications have been reviewed.    Allergies: Nutritional supplements    -------------------------------------------------- RESULTS  -------------------------------------------------    Lab  Results for orders placed during the hospital encounter of 09/09/13   CBC WITH AUTO DIFFERENTIAL       Result Value Range    WBC 26.5 (*) 4.5 - 11.5 E9/L    RBC 4.78  3.80 - 5.80 E12/L    Hemoglobin 15.8  12.5 - 16.5 g/dL    Hematocrit 16.1  09.6 - 54.0 %    MCV 96.6  80.0 - 99.9 fL    MCH 33.1  26.0 - 35.0 pg    MCHC 34.2  32.0 - 34.5 %    RDW 14.8  11.5 - 15.0 fL    Platelets 295  130 - 450 E9/L    MPV 8.6  7.0 - 12.0 fL    PLATELET SLIDE REVIEW Normal      Neutrophils Relative 87 (*) 43 - 80 %    Lymphocytes Relative 10 (*) 20 - 42 %    Monocytes Relative 3  2 - 12 %    Eosinophils Relative Percent 0  0 - 6 %    Basophils Relative 0  0 - 2 %    Neutrophils Absolute 23.06 (*) 1.80 - 7.30 E9/L    Lymphocytes Absolute 2.65  1.50 - 4.00 E9/L    Monocytes Absolute 0.80  0.10 - 0.95 E9/L    Eosinophils Absolute 0.00 (*) 0.05 - 0.50 E9/L    Basophils Absolute 0.00  0.00 - 0.20 E9/L    RBC Morphology Normal     COMPREHENSIVE METABOLIC PANEL       Result Value Range    Sodium 135  132 - 146 mmol/L    Potassium 5.2 (*) 3.5 - 5.0 mmol/L    Chloride 96 (*) 98 - 107 mmol/L    CO2 28  22 - 29 mmol/L    Glucose 123 (*) 74 - 109 mg/dL    BUN 12  6 - 20 mg/dL    CREATININE 0.8  0.7 - 1.2 mg/dL    GFR Non-African American >60  >=60 mL/min/1.73    GFR African American >60      Calcium 9.3  8.6 - 10.2 mg/dL    Total Protein 7.0  6.4 - 8.3 g/dL    Alb 4.2  3.5 - 5.2 g/dL    Total Bilirubin 2.1 (*) 0.0 - 1.2 mg/dL    Alkaline Phosphatase 64  40 - 129 U/L    ALT 11  0 - 40 U/L    AST 11  0 - 39 U/L       Radiology  This X-Ray is independently viewed and interpreted by me:   - Study: Chest X-Ray   - Number of Views: 1  - Findings: Mediastinum is normal, No infiltrate, No effusion, No cardiomegaly and No pneumothorax    Night read by [place] Radiologist as follows:  Exam type: CTA chest  Impression: Negative for PE. Significant COPD bilateral infiltrate      EKG:  This EKG is signed  and interpreted by me.    Rate:  109  Rhythm: Sinus tachy  Interpretation: sinus tachycardia    ------------------------- NURSING NOTES AND VITALS REVIEWED ---------------------------  Date / Time Roomed:  09/09/2013  2:27 AM  ED Bed Assignment:  08/08    The nursing notes within the ED encounter and vital signs as below have been reviewed.   Patient Vitals for the past 24 hrs:   BP Temp Temp src Pulse Resp SpO2 Height Weight   09/09/13 0359 120/75 mmHg - - 106 16 96 % - -   09/09/13 0231 120/75 mmHg 98.8 ??F (37.1 ??C) Oral 115 28 95 % 6\' 2"  (1.88 m) 200 lb (90.719 kg)       Oxygen Saturation Interpretation: Abnormal and Improved after treatment      ------------------------------------------ PROGRESS NOTES ------------------------------------------  Re-evaluation(s):  Time: 0350.  Patient???s symptoms are improving  Repeat physical examination is improved    Time: 0530 .  Patient???s symptoms are improving  Repeat physical examination is improved    I have spoken with the patient and discussed today???s results, in addition to providing specific details for the plan of care and counseling regarding the diagnosis and prognosis.  Their questions are answered at this time and they are agreeable with the plan.      --------------------------------- ADDITIONAL PROVIDER NOTES ---------------------------------  Consultations:  Spoke with Dr. Harlin Rain,  They will admit this patient.    This patient's ED course included: a personal history and physicial eaxmination, re-evaluation prior to disposition, multiple bedside re-evaluations, IV medications and continuous pulse oximetry    This patient has remained hemodynamically stable during their ED course.    Please note that the withdrawal or failure to initiate urgent interventions for this patient would likely result in a life threatening deterioration or permanent disability.      Accordingly this patient received 30 minutes of critical care time, excluding separately billable  procedures. Systems at risk for deterioration include: respiratory.      Clinical Impression  1. Pneumonia, organism unspecified    2. Respiratory distress    3. COPD (chronic obstructive pulmonary disease) (HCC)          Disposition  Patient's disposition: Admit to telemetry  Patient's condition is stable.      Guillerm[place], DO  09/09/13 [phone]

## 2013-09-09 NOTE — ED Notes (Signed)
Patient to CT at this time.    Julian Hy, RN  09/09/13 [phone]

## 2013-09-09 NOTE — H&P (Signed)
Internal Medicine Resident History & Physical     Chief Complaint: Dyspnea  Reason for Admission: Hypoxia, COPD, Severe sepsis  Primary Care Physician: Comer Locket, DO  Code status:Full    History of Present Illness  Bruce Aguirre is a 57 y.o. year old male who  has a past medical history of COPD (chronic obstructive pulmonary disease) (HCC); Asthma; and Former smoker..     The patient presents with severe dyspnea that began about 2-3 weeks ago but became acutely worse at 5 pm yesterday. He states that his wife is sick as well with similar symptoms. These symptoms were associated with coughing, wheezing and sinus congestion. He saw his PCP and was put on antibiotics and a Prednisone taper that helped but did not resolve the symptoms. He has known COPD and is a former smoker. He had difficulty getting out 2-3 sentences at the time of these symptoms being the worst, but now is feeling much better. The patient did have mild chest discomfort (no pain) with deep breathing. He admitted to fevers, chills, without abdominal pain, n/v/d/c. He is coughing up a lot of thick mucous.    Emergency Department Course  On presentation to the emergency department, the patient had laboratory work as well as imaging studies performed. Apparently on presentation his O2 sat was 89%.    Vitals in ED-   ED TRIAGE VITALS  BP: 117/85 mmHg, Temp: 97.8 ??F (36.6 ??C), Pulse: 94, Resp: 16 (non-labored), SpO2: 97 %  Labs-   Lab Results   Component Value Date    WBC 26.5* 09/09/2013    HGB 15.8 09/09/2013    HCT 46.2 09/09/2013    PLT 295 09/09/2013    NA 135 09/09/2013    K 5.2* 09/09/2013    CL 96* 09/09/2013    CREATININE 0.8 09/09/2013    BUN 12 09/09/2013    CO2 28 09/09/2013    GLUCOSE 123* 09/09/2013    ALT 11 09/09/2013    AST 11 09/09/2013     Lab Results   Component Value Date    CKTOTAL 45 04/09/2012    CKMB 0.7 04/09/2012    TROPONINI 0.00 04/09/2012       ED Radiology-  CXR- hyperinflation and flat diaphragms   CT chest- COPD changes (diffuse bullae) and  basilar infiltrates vs interstitial changes    EKG-   EKG: sinus tachycardia, interventricular conduction delay, no ST or T wave changes.    Therapy in ED-   Medications   levofloxacin (LEVAQUIN) 500 MG/100ML infusion 500 mg (500 mg Intravenous Given 09/09/13 0358)   methylPREDNISolone sodium (SOLU-MEDROL) injection 125 mg (125 mg Intravenous Given 09/09/13 0240)   ioversol (OPTIRAY) 68 % injection 100 mL (100 mLs Intravenous Given 09/09/13 0425)         Past Medical History   Diagnosis Date   ??? COPD (chronic obstructive pulmonary disease) (HCC)    ??? Asthma    ??? Former smoker        Past Surgical History   Procedure Laterality Date   ??? Appendectomy     ??? Tonsillectomy         Family History  History reviewed. No pertinent family history.    Social History  Patient lives at home with his wife.   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 His smoking use included Cigarettes. He smoked 0.00 packs per day. He does not have  any smokeless tobacco history on file.  ETOH:   reports that he does not drink alcohol.    Home Medications  Prior to Admission medications    Medication Sig Start Date End Date Taking? Authorizing Provider   albuterol-ipratropium (COMBIVENT) 18-103 MCG/ACT inhaler Inhale 2 puffs into the lungs every 6 hours as needed for Wheezing.   Yes Historical Provider, MD   fluticasone-salmeterol (ADVAIR DISKUS) 250-50 MCG/DOSE AEPB Inhale 1 puff into the lungs 2 times daily.   Yes Historical Provider, MD   tiotropium (SPIRIVA) 18 MCG inhalation capsule Inhale 18 mcg into the lungs daily.   Yes Historical Provider, MD   Budesonide-Formoterol Fumarate (SYMBICORT IN) Inhale 2 puffs into the lungs 2 times daily.   Yes Historical Provider, MD   albuterol (ACCUNEB) 0.63 MG/3ML nebulizer solution Take 1 ampule by nebulization every 6 hours as needed for Wheezing or Shortness of Breath.   Yes Historical Provider, MD   naproxen (NAPROSYN)  500 MG tablet Take 1 tablet by mouth 3 times daily (with meals) for 30 doses. 04/09/12 04/19/12  Cyril Mourning, DO       Allergies  Allergies   Allergen Reactions   ??? Nutritional Supplements Anaphylaxis       Review of Systems  Please see HPI above. All bolded are positive. All un-bolded are negative.  Gen: fever, chills, fatigue, generalized weakness, diaphoresis, increase in thirst, unintentional weight change, loss of appetite  Head: headache, vision change, hearing loss  Chest: chest pain, chest heaviness, palpitations  Lungs: shortness of breath, wheezing, coughing  Abdomen: abdominal pain, nausea, vomiting, diarrhea, constipation, melena, hematochezia, hematemesis  Extremities: lower extremity edema, myalgias, arthralgias  Urinary: dysuria, hematuria, or increase in frequency  Neurologic: lightheadedness, dizziness, confusion, syncope, numbness, tingling, weakness  Psychiatric: depression, suicidal ideation, or anxiety    Objective  BP 117/85   Pulse 94   Temp(Src) 97.8 ??F (36.6 ??C) (Oral)   Resp 16   Ht 6\' 2"  (1.88 m)   Wt 200 lb (90.719 kg)   BMI 25.67 kg/m2   SpO2 97%    Physical Exam:  General: awake, alert, oriented to person, place, time, and purpose, appears stated age, cooperative, no acute distress, pleasant   Head: normocephalic, atraumatic  Eyes: conjunctivae/corneas clear, sclera non icteric  Mouth: mucous membranes moist  Neck: no JVD, no adenopathy, no carotid bruit, neck is supple, trachea is midline  Back: ROM normal, no CVA tenderness.  Lungs: diminished breath sounds, bibasilar crackles, scant wheezing in bases,   Heart: regular rate and regular rhythm, no murmur, normal S1, S2  Abdomen: soft, non-tender; bowel sounds normal; no masses, no organomegaly  Extremities: no lower extremity edema, extremities atraumatic, no cyanosis, no clubbing, 2+ pedal pulses palpated  Skin: normal color, normal texture, normal turgor, no rashes, no lesions  Neurologic: 5/5 muscle strength throughout, normal  muscle tone throughout, PERRLA, EOMI, face symmetric, hearing intact, uvula midline, tongue midline, speech appropriate without slurring.  Osteopathic/Structural Exam: examined in seated and supine position, normal thoracic kyphosis, normal lumbar lordosis, no acute changes.    Mircobiology  Blood cultures have been sent.    Assessment/Plan  Patient is a 57 y.o. male who presented with dyspnea. The active problem list is as follows:    Acute hypoxic respiratory failure secondary to COPD, CAP, and severe sepsis  ?? Continuous telemetry monitoring  ?? Continue nasal canula oxygen  ?? Cycle troponin  ?? Check 2D echo in am  ?? CTA chest ruled out pulmonary embolism  ??  He will need walked without O2 once moving more air  ?? Mucniex to thin secretions     Chronic obstructive pulmonary disease with exacerbation  ?? Nasal canula oxygen prn with pulse ox monitoring  ?? Duoneb breathing treatments, Will hold Spiriva on short acting anticholinergics  ?? Start IV corticosteroids, DC PO steroids  ?? Start Pulmicort and Performomist nebs, will hold Advair  ?? Antibiotics for further anti-inflammatory effects  ?? Needs outpatient PFT's  ?? Check for alpha-1-antitrypsin deficiency   ?? Continue Theophylline     Community acquired pneumonia with secondary severe sepsis  ?? Meets SIRS criteria with:  ?? Respirations (>20 bpm)  ?? Elevated heart rate (>90 beats per min)  ?? Leukocytosis (white-cell count, >12,000/mm3)  ?? Leukopenia (white-cell count, <4000/mm3)  ?? Meets severe sepsis criteria with sepsis plus:  ?? Arterial hypoxemia  ?? Lactic acid now  ?? Blood, sputum cultures  ?? Pneumococcal and legionella urine ag  ?? Tylenol PRN fever  ?? Antibiotics: Levaquin  ?? IV fluid hydration  ?? Stress-ulcer prophylaxis    Hyperkalemia  ?? IVF  ?? Monitor potassium level closely    Full problem list includes:  Patient Active Problem List    Diagnosis Date Noted   ??? COPD exacerbation (HCC) 09/09/2013     Priority: High   ??? Acute respiratory failure with hypoxia  (HCC) 09/09/2013   ??? Severe sepsis without septic shock (HCC) 09/09/2013   ??? CAP (community acquired pneumonia) 09/09/2013   ??? Hyperkalemia 09/09/2013   ??? COPD (chronic obstructive pulmonary disease) (HCC)    ??? Asthma    ??? Former smoker        ?? Continue to manage other medical conditions listed above as previously managed.  ?? Routine labs in am  ?? DVT prophylaxis.  ?? Please see orders for further management and care.  ?? This patient was discussed with Dr. Harlin Rain.    Louis Volino DO, PGY3  09/09/2013  6:42 AM      I performed a history and physical examination of the patient and discussed the patient's management with the resident. I reviewed the resident's note and agree with the documented findings and plan of care.    Alvin Critchley, D.O., FACOI  1:30 PM  09/09/2013

## 2013-09-10 LAB — RESP VIRUSES DFA PANEL
Adenovirus, DFA: NEGATIVE
DFA, Influenza B: NEGATIVE
DFA, RSV: NEGATIVE
Influenza A,DFA: NEGATIVE
Parafluenza Type 1 DFA: NEGATIVE
Parafluenza Type 2 DFA: NEGATIVE
Parafluenza Type 3 DFA: NEGATIVE

## 2013-09-10 LAB — EKG 12-LEAD
ECG Heart Rate: 106 /min
ECG P Duration: 98 ms
ECG QRS Axis: 80 deg
ECG QRS Duration: 94 ms
ECG QT Dispersion: 36 ms
ECG QTC Interval: 406 ms
ECG RR Interval: 566 ms
P Axis: 80 deg
P-R Interval: 116 ms
Q-T Interval: 306 ms
T Axis: 64 deg

## 2013-09-10 LAB — CBC WITH AUTO DIFFERENTIAL
Basophils %: 0 % (ref 0–2)
Basophils Absolute: 0 E9/L (ref 0.00–0.20)
Eosinophils %: 0 % (ref 0–6)
Eosinophils Absolute: 0 E9/L — ABNORMAL LOW (ref 0.05–0.50)
Hematocrit: 38.3 % (ref 37.0–54.0)
Hemoglobin: 13.1 g/dL (ref 12.5–16.5)
Lymphocytes %: 4 % — ABNORMAL LOW (ref 20–42)
Lymphocytes Absolute: 0.95 E9/L — ABNORMAL LOW (ref 1.50–4.00)
MCH: 33.3 pg (ref 26.0–35.0)
MCHC: 34.3 % (ref 32.0–34.5)
MCV: 97.3 fL (ref 80.0–99.9)
MPV: 8.4 fL (ref 7.0–12.0)
Monocytes %: 2 % (ref 2–12)
Monocytes Absolute: 0.47 E9/L (ref 0.10–0.95)
Neutrophils %: 94 % — ABNORMAL HIGH (ref 43–80)
Neutrophils Absolute: 22.28 E9/L — ABNORMAL HIGH (ref 1.80–7.30)
PLATELET SLIDE REVIEW: NORMAL
Platelets: 264 E9/L (ref 130–450)
RBC: 3.94 E12/L (ref 3.80–5.80)
RDW: 15.1 fL — ABNORMAL HIGH (ref 11.5–15.0)
WBC: 23.7 E9/L — ABNORMAL HIGH (ref 4.5–11.5)

## 2013-09-10 LAB — LIPID PANEL
Cholesterol, Total: 183 mg/dL (ref 0–199)
HDL: 65 mg/dL (ref >40)
LDL Calculated: 103 mg/dL — ABNORMAL HIGH (ref 0–99)
Triglycerides: 74 mg/dL (ref 0–149)
VLDL Cholesterol Calculated: 15 mg/dL

## 2013-09-10 LAB — LACTIC ACID: Lactic Acid: 4.1 mmol/L (ref 0.5–2.2)

## 2013-09-10 LAB — COMPREHENSIVE METABOLIC PANEL
ALT: 11 U/L (ref 0–40)
AST: 7 U/L (ref 0–39)
Albumin: 3.5 g/dL (ref 3.5–5.2)
Alkaline Phosphatase: 53 U/L (ref 40–129)
BUN: 12 mg/dL (ref 6–20)
CO2: 27 mmol/L (ref 22–29)
Calcium: 8.9 mg/dL (ref 8.6–10.2)
Chloride: 99 mmol/L (ref 98–107)
Creatinine: 0.7 mg/dL (ref 0.7–1.2)
GFR African American: >60
GFR Non-African American: >60 mL/min/{1.73_m2} (ref >=60)
Glucose: 180 mg/dL — ABNORMAL HIGH (ref 74–109)
Potassium: 4.6 mmol/L (ref 3.5–5.0)
Sodium: 135 mmol/L (ref 132–146)
Total Bilirubin: 0.8 mg/dL (ref 0.0–1.2)
Total Protein: 6 g/dL — ABNORMAL LOW (ref 6.4–8.3)

## 2013-09-10 LAB — VITAMIN D 25 HYDROX, D2 & D3: Vit D, 25-Hydroxy: 15 ng/mL — ABNORMAL LOW (ref 30–100)

## 2013-09-10 LAB — LEGIONELLA ANTIGEN, URINE

## 2013-09-10 LAB — STREP PNEUMONIAE ANTIGEN

## 2013-09-10 LAB — TSH: TSH: 0.212 u[IU]/mL — ABNORMAL LOW (ref 0.270–4.200)

## 2013-09-10 MED ORDER — ACETYLCYSTEINE 10 % IN SOLN
10 % | Freq: Three times a day (TID) | RESPIRATORY_TRACT | Status: DC
Start: 2013-09-10 — End: 2013-09-12
  Administered 2013-09-10 – 2013-09-11 (×3): via RESPIRATORY_TRACT

## 2013-09-10 MED ADMIN — diphenhydrAMINE (BENADRYL) tablet 25 mg: ORAL | @ 03:00:00 | NDC 00904555159

## 2013-09-10 MED ADMIN — guaiFENesin (MUCINEX) SR tablet 600 mg: ORAL | @ 15:00:00 | NDC 63824000850

## 2013-09-10 MED ADMIN — ipratropium-albuterol (DUONEB) nebulizer solution 1 ampule: RESPIRATORY_TRACT | @ 03:00:00 | NDC 00487020101

## 2013-09-10 MED ADMIN — ipratropium-albuterol (DUONEB) nebulizer solution 1 ampule: RESPIRATORY_TRACT | @ 23:00:00 | NDC 00487020101

## 2013-09-10 MED ADMIN — enoxaparin (LOVENOX) injection 40 mg: SUBCUTANEOUS | @ 15:00:00 | NDC 00075801401

## 2013-09-10 MED ADMIN — guaiFENesin (MUCINEX) SR tablet 600 mg: ORAL | @ 02:00:00 | NDC 63824000850

## 2013-09-10 MED ADMIN — ipratropium-albuterol (DUONEB) nebulizer solution 1 ampule: RESPIRATORY_TRACT | @ 07:00:00 | NDC 00487020101

## 2013-09-10 MED ADMIN — ipratropium-albuterol (DUONEB) nebulizer solution 1 ampule: RESPIRATORY_TRACT | @ 15:00:00 | NDC 00487020101

## 2013-09-10 MED ADMIN — 0.9 % sodium chloride infusion: INTRAVENOUS | @ 02:00:00 | NDC 00338004904

## 2013-09-10 MED ADMIN — methylPREDNISolone sodium (SOLU-MEDROL) injection 60 mg: INTRAVENOUS | @ 02:00:00 | NDC 00009004725

## 2013-09-10 MED ADMIN — methylPREDNISolone sodium (SOLU-MEDROL) injection 60 mg: INTRAVENOUS | @ 14:00:00 | NDC 00009004725

## 2013-09-10 MED ADMIN — budesonide (PULMICORT) nebulizer suspension 250 mcg: RESPIRATORY_TRACT | @ 12:00:00 | NDC 00093681573

## 2013-09-10 MED ADMIN — levofloxacin (LEVAQUIN) 500 MG/100ML infusion 500 mg: INTRAVENOUS | @ 08:00:00 | NDC 00143972101

## 2013-09-10 MED ADMIN — formoterol (PERFOROMIST) nebulizer solution 20 mcg: RESPIRATORY_TRACT | @ 12:00:00 | NDC 49502060595

## 2013-09-10 MED ADMIN — 0.9 % sodium chloride infusion: INTRAVENOUS | @ 16:00:00 | NDC 00338004904

## 2013-09-10 MED ADMIN — methylPREDNISolone sodium (SOLU-MEDROL) injection 60 mg: INTRAVENOUS | @ 08:00:00 | NDC 00009004725

## 2013-09-10 MED ADMIN — methylPREDNISolone sodium (SOLU-MEDROL) injection 40 mg: INTRAVENOUS | @ 22:00:00 | NDC 00009004725

## 2013-09-10 MED ADMIN — ipratropium-albuterol (DUONEB) nebulizer solution 1 ampule: RESPIRATORY_TRACT | @ 19:00:00 | NDC 00487020101

## 2013-09-10 MED ADMIN — budesonide (PULMICORT) nebulizer suspension 250 mcg: RESPIRATORY_TRACT | @ 23:00:00 | NDC 00093681573

## 2013-09-10 MED ADMIN — formoterol (PERFOROMIST) nebulizer solution 20 mcg: RESPIRATORY_TRACT | @ 23:00:00 | NDC 49502060595

## 2013-09-10 MED FILL — SOLU-MEDROL 125 MG IJ SOLR: 125 MG | INTRAMUSCULAR | Qty: 125

## 2013-09-10 MED FILL — LOVENOX 40 MG/0.4ML SC SOLN: 40 MG/0.4ML | SUBCUTANEOUS | Qty: 0.4

## 2013-09-10 MED FILL — ACETYLCYSTEINE 10 % IN SOLN: 10 % | RESPIRATORY_TRACT | Qty: 4

## 2013-09-10 MED FILL — METHYLPREDNISOLONE SODIUM SUCC 125 MG IJ SOLR: 125 MG | INTRAMUSCULAR | Qty: 125

## 2013-09-10 MED FILL — NORMAL SALINE FLUSH 0.9 % IV SOLN: 0.9 % | INTRAVENOUS | Qty: 10

## 2013-09-10 MED FILL — METHYLPREDNISOLONE SODIUM SUCC 40 MG IJ SOLR: 40 MG | INTRAMUSCULAR | Qty: 40

## 2013-09-10 MED FILL — MUCINEX 600 MG PO TB12: 600 MG | ORAL | Qty: 1

## 2013-09-10 MED FILL — MAPAP 325 MG PO TABS: 325 MG | ORAL | Qty: 2

## 2013-09-10 MED FILL — LEVAQUIN 500 MG/100ML IV SOLN: 500 MG/100ML | INTRAVENOUS | Qty: 100

## 2013-09-10 MED FILL — THEOPHYLLINE ER 300 MG PO TB12: 300 MG | ORAL | Qty: 1

## 2013-09-10 MED FILL — BANOPHEN 25 MG PO TABS: 25 MG | ORAL | Qty: 1

## 2013-09-10 NOTE — Procedures (Signed)
2D echo complete.

## 2013-09-10 NOTE — Care Coordination-Inpatient (Signed)
Social Work / Discharge Planning  09/10/2013  2:26 PM SW met briefly with pt, reports he resides at home with his "Mrs." and that he has no home going needs.  Evlyn CourierKendal Decorey Wahlert, LSW

## 2013-09-10 NOTE — Progress Notes (Signed)
HPI: Bruce Aguirre is a 57 y.o. Caucasian male who is seen today in follow up.  Bruce Aguirre is feeling much better. He had a very restful night of sleep. He continues to cough but is unable to produce much sputum.  However, he has been provided a container for a sputum sample should he need it.  He has been eating well. Regular bowel movements. Able to walking to bathroom without oxygen.  Denies chest pain, nausea, vomiting, diarrhea, constipation, headaches. There is no family present at the bedside.  Patient voiced no new complaints since hospital admission. Questions, answers, and tests reviewed.    ROS:  General:  No fever or chills.  No lightheadedness or dizziness  Cardiovascular: Denies any chest pain, irregular heartbeats, or palpitations.  Respiratory: Improved shortness of breath, coughing, no sputum production, hemoptysis, or     wheezing.  Gastrointestinal: Denies nausea, vomiting, diarrhea, or constipation.  Denies any    abdominal pain.  Extremities: Denies any lower extremity swelling or edema.  Neurology:  Denies any headache or focal neurological deficits. No weakness or    paresthesia.  Derm:  Denies any rashes, ulcers, or excoriations.  Denies bruising.  Genitourinary:  Denies any urgency, frequency, hematuria.  Voiding without difficulty.  Musculoskeletal:  Denies any myalgia or arthralgia.  No back pain.    Physical Exam:    Filed Vitals:    09/10/13 0700   BP: 113/69   Pulse: 95   Temp: 97.8 ??F (36.6 ??C)   Resp: 18       General:  57 y.o. yo  male awake, A&O, NAD  HEENT:  PERRLA.  EOMI.  Sclera clear.  Buccal mucosa moist.  Neck:  Supple. Trachea midline. No thyromegaly. No JVD. No bruits.  Heart:  Rhythm regular, rate controlled.  No murmurs.  Lungs:  Diminished bilaterally. Wheezes throughout bilateral lung bases. No rhonchi. No    rales.  Abdomen: Soft. Non-tender. Non-distended. Bowel sounds positive. No    organomegaly or masses.  No pain on palpation.  Extremities:  Peripheral pulses  present.  No peripheral edema.  No ulcers.  Neurologic:  Alert x 3.  No focal deficit.  Cranial nerves grossly intact.  Skin:  No petechia. No hemorrhage. No wounds.    CBC with Differential:    Lab Results   Component Value Date    WBC 23.7 09/10/2013    RBC 3.94 09/10/2013    HGB 13.1 09/10/2013    HGB 13.0 05/23/2010    HCT 38.3 09/10/2013    HCT 37.6 05/23/2010    PLT 264 09/10/2013    MCV 97.3 09/10/2013    MCH 33.3 09/10/2013    MCHC 34.3 09/10/2013    RDW 15.1 09/10/2013    SEGSPCT 60 04/09/2012    LYMPHOPCT 4 09/10/2013    LYMPHOPCT 27 04/09/2012    MONOPCT 2 09/10/2013    MONOPCT 11 04/09/2012    BASOPCT 0 09/10/2013    BASOPCT 1 04/09/2012    MONOSABS 0.47 09/10/2013    MONOSABS 1.20 04/09/2012    LYMPHSABS 0.95 09/10/2013    LYMPHSABS 2.90 04/09/2012    EOSABS 0.00 09/10/2013    EOSABS 0.20 04/09/2012    BASOSABS 0.00 09/10/2013    BASOSABS 0.01 05/23/2010     CMP:    Lab Results   Component Value Date    NA 135 09/10/2013    K 4.6 09/10/2013    CL 99 09/10/2013    CO2 27 09/10/2013  BUN 12 09/10/2013    BUN 17 05/23/2010    CREATININE 0.7 09/10/2013    CREATININE 0.7 05/23/2010    GFRAA >60 09/10/2013    LABGLOM >60 09/10/2013    LABGLOM >60 04/09/2012    GLUCOSE 180 09/10/2013    GLUCOSE 119 05/23/2010    PROT 6.0 09/10/2013    LABALBU 3.5 09/10/2013    LABALBU 3.9 05/23/2010    CALCIUM 8.9 09/10/2013    BILITOT 0.8 09/10/2013    BILITOT 0.6 05/23/2010    ALKPHOS 53 09/10/2013    AST 7 09/10/2013    ALT 11 09/10/2013     CTA chest: 09/09/13  IMPRESSION:   1. No pulmonary artery embolism bilaterally.   2. Diffuse emphysematous changes throughout the lungs with   findings most consistent with chronic changes throughout both   lungs.   3. Patchy and somewhat nodular densities within both lungs,   greatest within both lower lobes, which may represent bilateral   lung atelectasis, infiltrate, and/or interval scarring.   4. Stable appearance of subcentimeter noncalcified nodule within   right middle lobe of lung and within left  lower lobe of lung.   Secondary to the stability of this finding of greater than 3   years, no further followup is recommended.   5. 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   6. Prominent main pulmonary artery, raising the question of   pulmonary artery hypertension.     CXR 09/09/13  IMPRESSION:   1. COPD   2. Bibasilar interstitial infiltrates and/or atelectasis, left   greater than right       Other Data:    Scheduled Medications:  ??? sodium chloride flush       ??? levofloxacin  500 mg Intravenous Q24H   ??? budesonide  250 mcg Nebulization BID   ??? enoxaparin  40 mg Subcutaneous Daily   ??? formoterol  20 mcg Nebulization Q12H   ??? guaiFENesin  600 mg Oral BID   ??? theophylline CR (12 hour)  300 mg Oral Weekly   ??? ipratropium-albuterol  1 ampule Inhalation Q4H   ??? methylPREDNISolone  60 mg Intravenous Q6H       PRN Medications:  acetaminophen, ondansetron    Infusion Medications:  ??? sodium chloride 84 mL/hr at 09/09/13 2110       Assessment:   1. Acute hypoxic respiratory failure secondary to COPD, CAP, and severe sepsis  2. Chronic obstructive pulmonary disease with exacerbation  3. Community acquired pneumonia with secondary severe sepsis  4.  Hyperkalemia - resolved    Full problem list includes:  Patient Active Problem List     Diagnosis  Date Noted    ???  COPD exacerbation (HCC)  09/09/2013      Priority: High    ???  Acute respiratory failure with hypoxia (HCC)  09/09/2013    ???  Severe sepsis without septic shock (HCC)  09/09/2013    ???  CAP (community acquired pneumonia)  09/09/2013    ???  Hyperkalemia  09/09/2013    ???  COPD (chronic obstructive pulmonary disease) (HCC)     ???  Asthma     ???  Former smoker         Patient appears to be improving clinically. WBC has improved slightly 26.5 to 23.7. Lactic acid has improved form 5.5 to 4.1.  Potassium has normalized. Repeat CBC, BMP, lactic acid. Continue antibiotic therapy. Minor adjustment to  breathing treatments  to help thin secretions. Waiting results of ECHO. Adjust theophyline dose as it is currently sub-theraputic. Otherwise continue previous care. Please see orders for further management and care.    Dimas Alexandria, D.O., [facility]  09/10/2013  10:29 AM

## 2013-09-11 LAB — EKG 12-LEAD
ECG Heart Rate: 109 /min
ECG P Duration: 98 ms
ECG QRS Axis: 79 deg
ECG QRS Duration: 94 ms
ECG QT Dispersion: 76 ms
ECG QTC Interval: 417 ms
ECG RR Interval: 550 ms
P Axis: 77 deg
P-R Interval: 114 ms
Q-T Interval: 310 ms
T Axis: 37 deg

## 2013-09-11 LAB — CBC WITH AUTO DIFFERENTIAL
Basophils %: 0 % (ref 0–2)
Basophils Absolute: 0 E9/L (ref 0.00–0.20)
Eosinophils %: 0 % (ref 0–6)
Eosinophils Absolute: 0 E9/L — ABNORMAL LOW (ref 0.05–0.50)
Hematocrit: 36.7 % — ABNORMAL LOW (ref 37.0–54.0)
Hemoglobin: 12.6 g/dL (ref 12.5–16.5)
Lymphocytes %: 2 % — ABNORMAL LOW (ref 20–42)
Lymphocytes Absolute: 0.51 E9/L — ABNORMAL LOW (ref 1.50–4.00)
MCH: 33.1 pg (ref 26.0–35.0)
MCHC: 34.3 % (ref 32.0–34.5)
MCV: 96.6 fL (ref 80.0–99.9)
MPV: 8.6 fL (ref 7.0–12.0)
Monocytes %: 2 % (ref 2–12)
Monocytes Absolute: 0.51 E9/L (ref 0.10–0.95)
Neutrophils %: 96 % — ABNORMAL HIGH (ref 43–80)
Neutrophils Absolute: 24.29 E9/L — ABNORMAL HIGH (ref 1.80–7.30)
PLATELET SLIDE REVIEW: NORMAL
Platelets: 272 E9/L (ref 130–450)
RBC Morphology: NORMAL
RBC: 3.8 E12/L (ref 3.80–5.80)
RDW: 15 fL (ref 11.5–15.0)
WBC: 25.3 E9/L — ABNORMAL HIGH (ref 4.5–11.5)

## 2013-09-11 LAB — COMPREHENSIVE METABOLIC PANEL
ALT: 12 U/L (ref 0–40)
AST: 9 U/L (ref 0–39)
Albumin: 3.5 g/dL (ref 3.5–5.2)
Alkaline Phosphatase: 48 U/L (ref 40–129)
BUN: 12 mg/dL (ref 6–20)
CO2: 28 mmol/L (ref 22–29)
Calcium: 8.7 mg/dL (ref 8.6–10.2)
Chloride: 100 mmol/L (ref 98–107)
Creatinine: 0.6 mg/dL — ABNORMAL LOW (ref 0.7–1.2)
GFR African American: >60
GFR Non-African American: >60 mL/min/{1.73_m2} (ref >=60)
Glucose: 129 mg/dL — ABNORMAL HIGH (ref 74–109)
Potassium: 5.2 mmol/L — ABNORMAL HIGH (ref 3.5–5.0)
Sodium: 137 mmol/L (ref 132–146)
Total Bilirubin: 0.5 mg/dL (ref 0.0–1.2)
Total Protein: 5.7 g/dL — ABNORMAL LOW (ref 6.4–8.3)

## 2013-09-11 MED ADMIN — predniSONE (DELTASONE) tablet 20 mg: ORAL | @ 23:00:00 | NDC 00143973801

## 2013-09-11 MED ADMIN — enoxaparin (LOVENOX) injection 40 mg: SUBCUTANEOUS | @ 15:00:00 | NDC 00075801401

## 2013-09-11 MED ADMIN — ipratropium-albuterol (DUONEB) nebulizer solution 1 ampule: RESPIRATORY_TRACT | @ 08:00:00 | NDC 00487020101

## 2013-09-11 MED ADMIN — 0.9 % sodium chloride infusion: INTRAVENOUS | @ 18:00:00 | NDC 00338004904

## 2013-09-11 MED ADMIN — budesonide (PULMICORT) nebulizer suspension 250 mcg: RESPIRATORY_TRACT | @ 15:00:00 | NDC 00093681573

## 2013-09-11 MED ADMIN — ipratropium-albuterol (DUONEB) nebulizer solution 1 ampule: RESPIRATORY_TRACT | @ 04:00:00 | NDC 00487020101

## 2013-09-11 MED ADMIN — methylPREDNISolone sodium (SOLU-MEDROL) injection 40 mg: INTRAVENOUS | @ 15:00:00 | NDC 00009003930

## 2013-09-11 MED ADMIN — ipratropium-albuterol (DUONEB) nebulizer solution 1 ampule: RESPIRATORY_TRACT | @ 15:00:00 | NDC 00487020101

## 2013-09-11 MED ADMIN — formoterol (PERFOROMIST) nebulizer solution 20 mcg: RESPIRATORY_TRACT | @ 15:00:00 | NDC 49502060561

## 2013-09-11 MED ADMIN — diphenhydrAMINE (BENADRYL) tablet 25 mg: ORAL | @ 04:00:00 | NDC 00904555159

## 2013-09-11 MED ADMIN — ipratropium-albuterol (DUONEB) nebulizer solution 1 ampule: RESPIRATORY_TRACT | @ 19:00:00 | NDC 00487020101

## 2013-09-11 MED ADMIN — 0.9 % sodium chloride infusion: INTRAVENOUS | @ 04:00:00 | NDC 00338004904

## 2013-09-11 MED ADMIN — guaiFENesin (MUCINEX) SR tablet 600 mg: ORAL | @ 02:00:00 | NDC 63824000850

## 2013-09-11 MED ADMIN — levofloxacin (LEVAQUIN) 500 MG/100ML infusion 500 mg: INTRAVENOUS | @ 09:00:00 | NDC 25021013267

## 2013-09-11 MED ADMIN — ipratropium-albuterol (DUONEB) nebulizer solution 1 ampule: RESPIRATORY_TRACT | @ 23:00:00 | NDC 00487020101

## 2013-09-11 MED ADMIN — theophylline CR (12 hour) (THEODUR) tablet 300 mg: ORAL | @ 15:00:00 | NDC 00904588961

## 2013-09-11 MED ADMIN — methylPREDNISolone sodium (SOLU-MEDROL) injection 40 mg: INTRAVENOUS | @ 05:00:00 | NDC 00009003930

## 2013-09-11 MED ADMIN — ipratropium-albuterol (DUONEB) nebulizer solution 1 ampule: RESPIRATORY_TRACT | @ 12:00:00 | NDC 00487020101

## 2013-09-11 MED ADMIN — guaiFENesin (MUCINEX) SR tablet 600 mg: ORAL | @ 15:00:00 | NDC 63824000850

## 2013-09-11 MED FILL — PREDNISONE 20 MG PO TABS: 20 MG | ORAL | Qty: 1

## 2013-09-11 MED FILL — BANOPHEN 25 MG PO TABS: 25 MG | ORAL | Qty: 1

## 2013-09-11 MED FILL — METHYLPREDNISOLONE SODIUM SUCC 40 MG IJ SOLR: 40 MG | INTRAMUSCULAR | Qty: 40

## 2013-09-11 MED FILL — MUCINEX 600 MG PO TB12: 600 MG | ORAL | Qty: 1

## 2013-09-11 MED FILL — THEOPHYLLINE ER 300 MG PO TB12: 300 MG | ORAL | Qty: 1

## 2013-09-11 MED FILL — ADVAIR DISKUS 250-50 MCG/DOSE IN AEPB: 250-50 MCG/DOSE | RESPIRATORY_TRACT | Qty: 14

## 2013-09-11 MED FILL — MAPAP 325 MG PO TABS: 325 MG | ORAL | Qty: 2

## 2013-09-11 MED FILL — LEVAQUIN 500 MG/100ML IV SOLN: 500 MG/100ML | INTRAVENOUS | Qty: 100

## 2013-09-11 MED FILL — LOVENOX 40 MG/0.4ML SC SOLN: 40 MG/0.4ML | SUBCUTANEOUS | Qty: 0.4

## 2013-09-11 MED FILL — NORMAL SALINE FLUSH 0.9 % IV SOLN: 0.9 % | INTRAVENOUS | Qty: 10

## 2013-09-11 NOTE — Progress Notes (Signed)
[facility] Quality Flow/Interdisciplinary Rounds Progress Note        Quality Flow Rounds held on September 11, 2013    Disciplines Attending:  Bedside Nurse, Social Worker, Case Manager and Nursing Unit Leadership    Bruce Aguirre was admitted on 09/09/2013  6:29 AM    Anticipated Discharge Date:  Expected Discharge Date: 09/12/13    Disposition:    Braden Score:  Braden Scale Score: 23    Readmission Risk           Readmission Risk:       6.5       Age 56 or Greater: 0    Admitted from SNF or Requires Paid or Family Care: 0    Currently has CHF,COPD,ARF,CRI,or is on dialysis: 4    Takes more than 5 Prescription Medications: 0    Takes Digoxin,Insulin,Anticoagulants,Narcotics or ASA/Plavix: 0    Hospital Admit in Past 12 Months: 0    On Disability: 0    Patient Considers own Health: 2.5          Discussed patient goal for the day, patient clinical progression, and barriers to discharge.  The following Goal(s) of the Day/Commitment(s) have been identified:  IV solu-medrol, no discharge needs identified      Bruce Aguirre  September 11, 2013

## 2013-09-11 NOTE — Progress Notes (Signed)
HPI: Bruce Aguirre is a 57 y.o. Caucasian male who is seen today in follow up.  Bruce Aguirre is feeling much better today.  He is breathing much better and now is coughing producing light yellow colored sputum.  He is anxious for discharge home. There is no family present at the bedside.  Patient voiced no new complaints since hospital admission. Questions, answers, and tests reviewed.    ROS:  General:  No fever or chills.  No lightheadedness or dizziness  Cardiovascular: Denies any chest pain, irregular heartbeats, or palpitations.  Respiratory: Improved shortness of breath, coughing, yellow sputum production, hemoptysis, or     wheezing.  Gastrointestinal: Denies nausea, vomiting, diarrhea, or constipation.  Denies any    abdominal pain.  Extremities: Denies any lower extremity swelling or edema.  Neurology:  Denies any headache or focal neurological deficits. No weakness or    paresthesia.  Derm:  Denies any rashes, ulcers, or excoriations.  Denies bruising.  Genitourinary:  Denies any urgency, frequency, hematuria.  Voiding without difficulty.  Musculoskeletal:  Denies any myalgia or arthralgia.  No back pain.    Physical Exam:    Filed Vitals:    09/11/13 0741   BP: 102/58   Pulse: 78   Temp: 98 ??F (36.7 ??C)   Resp: 18       General:  57 y.o. yo  male awake, A&O, NAD  HEENT:  PERRLA.  EOMI.  Sclera clear.  Buccal mucosa moist.  Neck:  Supple. Trachea midline. No thyromegaly. No JVD. No bruits.  Heart:  Rhythm regular, rate controlled.  No murmurs.  Lungs:  Diminished bilaterally. No wheezes. No rhonchi. No    rales.  Abdomen: Soft. Non-tender. Non-distended. Bowel sounds positive. No    organomegaly or masses.  No pain on palpation.  Extremities:  Peripheral pulses present.  No peripheral edema.  No ulcers.  Neurologic:  Alert x 3.  No focal deficit.  Cranial nerves grossly intact.  Skin:  No petechia. No hemorrhage. No wounds.    CBC with Differential:    Lab Results   Component Value Date    WBC 25.3  09/11/2013    RBC 3.80 09/11/2013    HGB 12.6 09/11/2013    HGB 13.0 05/23/2010    HCT 36.7 09/11/2013    HCT 37.6 05/23/2010    PLT 272 09/11/2013    MCV 96.6 09/11/2013    MCH 33.1 09/11/2013    MCHC 34.3 09/11/2013    RDW 15.0 09/11/2013    SEGSPCT 60 04/09/2012    LYMPHOPCT 2 09/11/2013    LYMPHOPCT 27 04/09/2012    MONOPCT 2 09/11/2013    MONOPCT 11 04/09/2012    BASOPCT 0 09/11/2013    BASOPCT 1 04/09/2012    MONOSABS 0.51 09/11/2013    MONOSABS 1.20 04/09/2012    LYMPHSABS 0.51 09/11/2013    LYMPHSABS 2.90 04/09/2012    EOSABS 0.00 09/11/2013    EOSABS 0.20 04/09/2012    BASOSABS 0.00 09/11/2013    BASOSABS 0.01 05/23/2010     CMP:    Lab Results   Component Value Date    NA 137 09/11/2013    K 5.2 09/11/2013    CL 100 09/11/2013    CO2 28 09/11/2013    BUN 12 09/11/2013    BUN 17 05/23/2010    CREATININE 0.6 09/11/2013    CREATININE 0.7 05/23/2010    GFRAA >60 09/11/2013    LABGLOM >60 09/11/2013    LABGLOM >60 04/09/2012  GLUCOSE 129 09/11/2013    GLUCOSE 119 05/23/2010    PROT 5.7 09/11/2013    LABALBU 3.5 09/11/2013    LABALBU 3.9 05/23/2010    CALCIUM 8.7 09/11/2013    BILITOT 0.5 09/11/2013    BILITOT 0.6 05/23/2010    ALKPHOS 48 09/11/2013    AST 9 09/11/2013    ALT 12 09/11/2013     CTA chest: 09/09/13  IMPRESSION:   1. No pulmonary artery embolism bilaterally.   2. Diffuse emphysematous changes throughout the lungs with   findings most consistent with chronic changes throughout both   lungs.   3. Patchy and somewhat nodular densities within both lungs,   greatest within both lower lobes, which may represent bilateral   lung atelectasis, infiltrate, and/or interval scarring.   4. Stable appearance of subcentimeter noncalcified nodule within   right middle lobe of lung and within left lower lobe of lung.   Secondary to the stability of this finding of greater than 3   years, no further followup is recommended.   5. 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   6.  Prominent main pulmonary artery, raising the question of   pulmonary artery hypertension.     CXR 09/09/13  IMPRESSION:   1. COPD   2. Bibasilar interstitial infiltrates and/or atelectasis, left   greater than right       Other Data:    Scheduled Medications:  ??? sodium chloride flush       ??? acetylcysteine  4 mL Inhalation TID   ??? methylPREDNISolone  40 mg Intravenous Q8H   ??? theophylline CR (12 hour)  300 mg Oral Daily   ??? levofloxacin  500 mg Intravenous Q24H   ??? budesonide  250 mcg Nebulization BID   ??? enoxaparin  40 mg Subcutaneous Daily   ??? formoterol  20 mcg Nebulization Q12H   ??? guaiFENesin  600 mg Oral BID   ??? ipratropium-albuterol  1 ampule Inhalation Q4H       PRN Medications:  acetaminophen, ondansetron    Infusion Medications:  ??? sodium chloride 84 mL/hr at 09/11/13 1237       Assessment:   1. Acute hypoxic respiratory failure secondary to COPD, CAP, and severe sepsis  2. Chronic obstructive pulmonary disease with exacerbation  3. Community acquired pneumonia with secondary severe sepsis  4.  Hyperkalemia - resolved    Full problem list includes:  Patient Active Problem List     Diagnosis  Date Noted    ???  COPD exacerbation (HCC)  09/09/2013      Priority: High    ???  Acute respiratory failure with hypoxia (HCC)  09/09/2013    ???  Severe sepsis without septic shock (HCC)  09/09/2013    ???  CAP (community acquired pneumonia)  09/09/2013    ???  Hyperkalemia  09/09/2013    ???  COPD (chronic obstructive pulmonary disease) (HCC)     ???  Asthma     ???  Former smoker         Patient appears to be improving clinically. Will stop IV steroids and monitor WBC count.  He would like to go home tomorrow, but will evaluate then to make sure his respiratory status is stable.  Otherwise continue previous care. Please see orders for further management and care.    Dimas Alexandria, D.O., [facility]  09/11/2013  2:31 PM

## 2013-09-12 LAB — CBC WITH AUTO DIFFERENTIAL
Basophils %: 0 % (ref 0–2)
Basophils Absolute: 0 E9/L (ref 0.00–0.20)
Eosinophils %: 0 % (ref 0–6)
Eosinophils Absolute: 0 E9/L — ABNORMAL LOW (ref 0.05–0.50)
Hematocrit: 38 % (ref 37.0–54.0)
Hemoglobin: 13.2 g/dL (ref 12.5–16.5)
Lymphocytes %: 8 % — ABNORMAL LOW (ref 20–42)
Lymphocytes Absolute: 1.57 E9/L (ref 1.50–4.00)
MCH: 33.5 pg (ref 26.0–35.0)
MCHC: 34.8 % — ABNORMAL HIGH (ref 32.0–34.5)
MCV: 96.4 fL (ref 80.0–99.9)
MPV: 8.3 fL (ref 7.0–12.0)
Monocytes %: 4 % (ref 2–12)
Monocytes Absolute: 0.78 E9/L (ref 0.10–0.95)
Neutrophils %: 88 % — ABNORMAL HIGH (ref 43–80)
Neutrophils Absolute: 17.25 E9/L — ABNORMAL HIGH (ref 1.80–7.30)
PLATELET SLIDE REVIEW: NORMAL
Platelets: 273 E9/L (ref 130–450)
RBC: 3.94 E12/L (ref 3.80–5.80)
RDW: 14.6 fL (ref 11.5–15.0)
WBC: 19.6 E9/L — ABNORMAL HIGH (ref 4.5–11.5)

## 2013-09-12 LAB — COMPREHENSIVE METABOLIC PANEL
ALT: 18 U/L (ref 0–40)
AST: 10 U/L (ref 0–39)
Albumin: 3.6 g/dL (ref 3.5–5.2)
Alkaline Phosphatase: 50 U/L (ref 40–129)
BUN: 11 mg/dL (ref 6–20)
CO2: 31 mmol/L — ABNORMAL HIGH (ref 22–29)
Calcium: 8.8 mg/dL (ref 8.6–10.2)
Chloride: 100 mmol/L (ref 98–107)
Creatinine: 0.7 mg/dL (ref 0.7–1.2)
GFR African American: >60
GFR Non-African American: >60 mL/min/{1.73_m2} (ref >=60)
Glucose: 107 mg/dL (ref 74–109)
Potassium: 4.6 mmol/L (ref 3.5–5.0)
Sodium: 138 mmol/L (ref 132–146)
Total Bilirubin: 0.6 mg/dL (ref 0.0–1.2)
Total Protein: 5.8 g/dL — ABNORMAL LOW (ref 6.4–8.3)

## 2013-09-12 MED ADMIN — levofloxacin (LEVAQUIN) 500 MG/100ML infusion 500 mg: INTRAVENOUS | @ 09:00:00 | NDC 00143972101

## 2013-09-12 MED ADMIN — ipratropium-albuterol (DUONEB) nebulizer solution 1 ampule: RESPIRATORY_TRACT | @ 13:00:00 | NDC 00487020101

## 2013-09-12 MED ADMIN — guaiFENesin (MUCINEX) SR tablet 600 mg: ORAL | @ 02:00:00 | NDC 63824000850

## 2013-09-12 MED ADMIN — diphenhydrAMINE (BENADRYL) tablet 25 mg: ORAL | @ 02:00:00 | NDC 00904555159

## 2013-09-12 MED ADMIN — sodium chloride flush 0.9 % injection: @ 09:00:00

## 2013-09-12 MED ADMIN — guaiFENesin (MUCINEX) SR tablet 600 mg: ORAL | @ 14:00:00 | NDC 63824000850

## 2013-09-12 MED ADMIN — ipratropium-albuterol (DUONEB) nebulizer solution 1 ampule: RESPIRATORY_TRACT | @ 04:00:00 | NDC 00487020101

## 2013-09-12 MED ADMIN — ketorolac (TORADOL) 30 MG/ML injection: INTRAMUSCULAR | @ 19:00:00 | NDC 00409379501

## 2013-09-12 MED ADMIN — ipratropium-albuterol (DUONEB) nebulizer solution 1 ampule: RESPIRATORY_TRACT | @ 08:00:00 | NDC 00487020101

## 2013-09-12 MED ADMIN — ipratropium-albuterol (DUONEB) nebulizer solution 1 ampule: RESPIRATORY_TRACT | @ 22:00:00 | NDC 00487020101

## 2013-09-12 MED ADMIN — theophylline CR (12 hour) (THEODUR) tablet 300 mg: ORAL | @ 14:00:00 | NDC 50111045901

## 2013-09-12 MED ADMIN — fluticasone-salmeterol (ADVAIR) 250-50 MCG/DOSE AEPB 1 puff: RESPIRATORY_TRACT | @ 22:00:00 | NDC 00173069604

## 2013-09-12 MED ADMIN — predniSONE (DELTASONE) tablet 20 mg: ORAL | @ 14:00:00 | NDC 00143973801

## 2013-09-12 MED ADMIN — enoxaparin (LOVENOX) injection 40 mg: SUBCUTANEOUS | @ 14:00:00 | NDC 00075801401

## 2013-09-12 MED ADMIN — ipratropium-albuterol (DUONEB) nebulizer solution 1 ampule: RESPIRATORY_TRACT | @ 19:00:00 | NDC 00487020101

## 2013-09-12 MED ADMIN — ipratropium-albuterol (DUONEB) nebulizer solution 1 ampule: RESPIRATORY_TRACT | @ 16:00:00 | NDC 00487020101

## 2013-09-12 MED ADMIN — fluticasone-salmeterol (ADVAIR) 250-50 MCG/DOSE AEPB 1 puff: RESPIRATORY_TRACT | @ 13:00:00 | NDC 00173069604

## 2013-09-12 MED FILL — PREDNISONE 20 MG PO TABS: 20 MG | ORAL | Qty: 1

## 2013-09-12 MED FILL — MAPAP 325 MG PO TABS: 325 MG | ORAL | Qty: 2

## 2013-09-12 MED FILL — MUCINEX 600 MG PO TB12: 600 MG | ORAL | Qty: 1

## 2013-09-12 MED FILL — LOVENOX 40 MG/0.4ML SC SOLN: 40 MG/0.4ML | SUBCUTANEOUS | Qty: 0.4

## 2013-09-12 MED FILL — NORMAL SALINE FLUSH 0.9 % IV SOLN: 0.9 % | INTRAVENOUS | Qty: 10

## 2013-09-12 MED FILL — LEVAQUIN 500 MG/100ML IV SOLN: 500 MG/100ML | INTRAVENOUS | Qty: 100

## 2013-09-12 MED FILL — KETOROLAC TROMETHAMINE 30 MG/ML IJ SOLN: 30 MG/ML | INTRAMUSCULAR | Qty: 1

## 2013-09-12 MED FILL — THEOPHYLLINE ER 300 MG PO TB12: 300 MG | ORAL | Qty: 1

## 2013-09-12 MED FILL — TRAMADOL HCL 50 MG PO TABS: 50 MG | ORAL | Qty: 1

## 2013-09-12 MED FILL — BANOPHEN 25 MG PO TABS: 25 MG | ORAL | Qty: 1

## 2013-09-12 MED FILL — LEVAQUIN 500 MG PO TABS: 500 MG | ORAL | Qty: 1

## 2013-09-12 NOTE — Progress Notes (Signed)
Patient at rest pulse ox 95 percent on room air. Patient ambulated in the hallway 500 ft. Pulse ox did not drop below 91 percent on room air.

## 2013-09-12 NOTE — Progress Notes (Signed)
[facility] Quality Flow/Interdisciplinary Rounds Progress Note        Quality Flow Rounds held on September 12, 2013    Disciplines Attending:  Bedside Nurse, Social Worker, Case Manager and Nursing Unit Leadership    Bruce Aguirre was admitted on 09/09/2013  6:29 AM    Anticipated Discharge Date:  Expected Discharge Date: 09/12/13    Disposition:    Braden Score:  Braden Scale Score: 23    Readmission Risk           Readmission Risk:       6.5       Age 56 or Greater: 0    Admitted from SNF or Requires Paid or Family Care: 0    Currently has CHF,COPD,ARF,CRI,or is on dialysis: 4    Takes more than 5 Prescription Medications: 0    Takes Digoxin,Insulin,Anticoagulants,Narcotics or ASA/Plavix: 0    Hospital Admit in Past 12 Months: 0    On Disability: 0    Patient Considers own Health: 2.5          Discussed patient goal for the day, patient clinical progression, and barriers to discharge.  The following Goal(s) of the Day/Commitment(s) have been identified:  No need for home oxygen. No other discharge needs identified.      [name]  September 12, 2013

## 2013-09-12 NOTE — Progress Notes (Signed)
HPI: Bruce Aguirre is a 57 y.o. Caucasian male who is seen today in follow up. He reports feeling well today and appears fairly anxious for discharge home. There is no family present at the bedside.  Patient voiced no new complaints since hospital admission. Questions, answers, and tests reviewed.    ROS:  General:  No fever or chills.  No lightheadedness or dizziness  Cardiovascular: Denies any chest pain, irregular heartbeats, or palpitations.  Respiratory: Improved shortness of breath, coughing, yellow sputum production, hemoptysis, or     wheezing.  Gastrointestinal: Denies nausea, vomiting, diarrhea, or constipation.  Denies any    abdominal pain.  Extremities: Denies any lower extremity swelling or edema.  Neurology:  Denies any headache or focal neurological deficits. No weakness or    paresthesia.  Derm:  Denies any rashes, ulcers, or excoriations.  Denies bruising.  Genitourinary:  Denies any urgency, frequency, hematuria.  Voiding without difficulty.  Musculoskeletal:  Denies any myalgia or arthralgia.  No back pain.    Physical Exam:    Filed Vitals:    09/12/13 1100   BP: 135/85   Pulse: 98   Temp: 97.8 ??F (36.6 ??C)   Resp: 18       General:  57 y.o. yo  male awake, A&O, NAD  HEENT:  PERRLA.  EOMI.  Sclera clear.  Buccal mucosa moist.  Neck:  Supple. Trachea midline. No thyromegaly. No JVD. No bruits.  Heart:  Rhythm regular, rate controlled.  No murmurs.  Lungs:  Diminished bilaterally. No wheezes. No rhonchi. No    rales.  Abdomen: Soft. Non-tender. Non-distended. Bowel sounds positive. No    organomegaly or masses.  No pain on palpation.  Extremities:  Peripheral pulses present.  No peripheral edema.  No ulcers.  Neurologic:  Alert x 3.  No focal deficit.  Cranial nerves grossly intact.  Skin:  No petechia. No hemorrhage. No wounds.    CBC with Differential:    Lab Results   Component Value Date    WBC 19.6 09/12/2013    RBC 3.94 09/12/2013    HGB 13.2 09/12/2013    HGB 13.0 05/23/2010    HCT 38.0  09/12/2013    HCT 37.6 05/23/2010    PLT 273 09/12/2013    MCV 96.4 09/12/2013    MCH 33.5 09/12/2013    MCHC 34.8 09/12/2013    RDW 14.6 09/12/2013    SEGSPCT 60 04/09/2012    LYMPHOPCT 8 09/12/2013    LYMPHOPCT 27 04/09/2012    MONOPCT 4 09/12/2013    MONOPCT 11 04/09/2012    BASOPCT 0 09/12/2013    BASOPCT 1 04/09/2012    MONOSABS 0.78 09/12/2013    MONOSABS 1.20 04/09/2012    LYMPHSABS 1.57 09/12/2013    LYMPHSABS 2.90 04/09/2012    EOSABS 0.00 09/12/2013    EOSABS 0.20 04/09/2012    BASOSABS 0.00 09/12/2013    BASOSABS 0.01 05/23/2010     CMP:    Lab Results   Component Value Date    NA 138 09/12/2013    K 4.6 09/12/2013    CL 100 09/12/2013    CO2 31 09/12/2013    BUN 11 09/12/2013    BUN 17 05/23/2010    CREATININE 0.7 09/12/2013    CREATININE 0.7 05/23/2010    GFRAA >60 09/12/2013    LABGLOM >60 09/12/2013    LABGLOM >60 04/09/2012    GLUCOSE 107 09/12/2013    GLUCOSE 119 05/23/2010    PROT 5.8 09/12/2013  LABALBU 3.6 09/12/2013    LABALBU 3.9 05/23/2010    CALCIUM 8.8 09/12/2013    BILITOT 0.6 09/12/2013    BILITOT 0.6 05/23/2010    ALKPHOS 50 09/12/2013    AST 10 09/12/2013    ALT 18 09/12/2013     CTA chest: 09/09/13  IMPRESSION:   1. No pulmonary artery embolism bilaterally.   2. Diffuse emphysematous changes throughout the lungs with   findings most consistent with chronic changes throughout both   lungs.   3. Patchy and somewhat nodular densities within both lungs,   greatest within both lower lobes, which may represent bilateral   lung atelectasis, infiltrate, and/or interval scarring.   4. Stable appearance of subcentimeter noncalcified nodule within   right middle lobe of lung and within left lower lobe of lung.   Secondary to the stability of this finding of greater than 3   years, no further followup is recommended.   5. 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   6. Prominent main pulmonary artery, raising the question of   pulmonary artery hypertension.      CXR 09/09/13  IMPRESSION:   1. COPD   2. Bibasilar interstitial infiltrates and/or atelectasis, left   greater than right       Other Data:    Scheduled Medications:  ??? predniSONE  20 mg Oral Daily   ??? fluticasone-salmeterol  1 puff Inhalation BID   ??? theophylline CR (12 hour)  300 mg Oral Daily   ??? levofloxacin  500 mg Intravenous Q24H   ??? enoxaparin  40 mg Subcutaneous Daily   ??? guaiFENesin  600 mg Oral BID   ??? ipratropium-albuterol  1 ampule Inhalation Q4H       PRN Medications:  diphenhydrAMINE, acetaminophen, ondansetron    Infusion Medications:       Assessment:   1. Acute hypoxic respiratory failure secondary to COPD, CAP, and severe sepsis  2. Chronic obstructive pulmonary disease with exacerbation  3. Community acquired pneumonia with secondary severe sepsis  4.  Hyperkalemia - resolved      Plan:  Patient appears to be improving clinically. IV steroids d/ced. Continue to monitor WBC; down-trending slowly. Cultures are negative. He is currently off supplemental O2 with adequate sats, does not use O2 at home. Continue to monitor for next 24 hours.  Otherwise continue previous care. Please see orders for further management and care.    Leeanne Deed, D.O., PGY 2  09/12/2013  1:11 PM

## 2013-09-13 LAB — CBC WITH AUTO DIFFERENTIAL
Basophils %: 0 % (ref 0–2)
Basophils Absolute: 0 E9/L (ref 0.00–0.20)
Eosinophils %: 0 % (ref 0–6)
Eosinophils Absolute: 0 E9/L — ABNORMAL LOW (ref 0.05–0.50)
Hematocrit: 39.9 % (ref 37.0–54.0)
Hemoglobin: 13.8 g/dL (ref 12.5–16.5)
Lymphocytes %: 24 % (ref 20–42)
Lymphocytes Absolute: 3.58 E9/L (ref 1.50–4.00)
MCH: 33.1 pg (ref 26.0–35.0)
MCHC: 34.5 % (ref 32.0–34.5)
MCV: 96.1 fL (ref 80.0–99.9)
MPV: 8.1 fL (ref 7.0–12.0)
Monocytes %: 4 % (ref 2–12)
Monocytes Absolute: 0.6 E9/L (ref 0.10–0.95)
Neutrophils %: 72 % (ref 43–80)
Neutrophils Absolute: 10.73 E9/L — ABNORMAL HIGH (ref 1.80–7.30)
PLATELET SLIDE REVIEW: NORMAL
Platelets: 255 E9/L (ref 130–450)
RBC Morphology: NORMAL
RBC: 4.15 E12/L (ref 3.80–5.80)
RDW: 14.4 fL (ref 11.5–15.0)
WBC: 14.9 E9/L — ABNORMAL HIGH (ref 4.5–11.5)

## 2013-09-13 LAB — COMPREHENSIVE METABOLIC PANEL
ALT: 20 U/L (ref 0–40)
AST: 11 U/L (ref 0–39)
Albumin: 3.5 g/dL (ref 3.5–5.2)
Alkaline Phosphatase: 48 U/L (ref 40–129)
BUN: 14 mg/dL (ref 6–20)
CO2: 32 mmol/L — ABNORMAL HIGH (ref 22–29)
Calcium: 8.9 mg/dL (ref 8.6–10.2)
Chloride: 99 mmol/L (ref 98–107)
Creatinine: 0.7 mg/dL (ref 0.7–1.2)
GFR African American: >60
GFR Non-African American: >60 mL/min/{1.73_m2} (ref >=60)
Glucose: 81 mg/dL (ref 74–109)
Potassium: 5 mmol/L (ref 3.5–5.0)
Sodium: 137 mmol/L (ref 132–146)
Total Bilirubin: 0.6 mg/dL (ref 0.0–1.2)
Total Protein: 5.7 g/dL — ABNORMAL LOW (ref 6.4–8.3)

## 2013-09-13 LAB — ALPHA-1-ANTITRYPSIN W/ PHENOTYPE: A-1 Antitrypsin: 154 mg/dL (ref 100–200)

## 2013-09-13 MED ORDER — THEOPHYLLINE ER 300 MG PO TB12
300 MG | ORAL_TABLET | Freq: Every day | ORAL | Status: DC
Start: 2013-09-13 — End: 2020-02-26

## 2013-09-13 MED ORDER — LEVOFLOXACIN 500 MG PO TABS
500 MG | ORAL_TABLET | Freq: Every day | ORAL | Status: AC
Start: 2013-09-13 — End: 2013-09-23

## 2013-09-13 MED ORDER — PREDNISONE 20 MG PO TABS
20 MG | ORAL_TABLET | Freq: Every day | ORAL | Status: AC
Start: 2013-09-13 — End: 2013-09-23

## 2013-09-13 MED ADMIN — diphenhydrAMINE (BENADRYL) tablet 25 mg: ORAL | @ 02:00:00 | NDC 00904555159

## 2013-09-13 MED ADMIN — ipratropium-albuterol (DUONEB) nebulizer solution 1 ampule: RESPIRATORY_TRACT | @ 08:00:00 | NDC 00487020101

## 2013-09-13 MED ADMIN — ipratropium-albuterol (DUONEB) nebulizer solution 1 ampule: RESPIRATORY_TRACT | @ 04:00:00 | NDC 00487020101

## 2013-09-13 MED ADMIN — guaiFENesin (MUCINEX) SR tablet 600 mg: ORAL | @ 02:00:00 | NDC 63824000850

## 2013-09-13 MED ADMIN — guaiFENesin (MUCINEX) SR tablet 600 mg: ORAL | @ 16:00:00 | NDC 63824000850

## 2013-09-13 MED ADMIN — fluticasone-salmeterol (ADVAIR) 250-50 MCG/DOSE AEPB 1 puff: RESPIRATORY_TRACT | @ 12:00:00 | NDC 00173069604

## 2013-09-13 MED ADMIN — predniSONE (DELTASONE) tablet 20 mg: ORAL | @ 16:00:00 | NDC 00143973810

## 2013-09-13 MED ADMIN — theophylline CR (12 hour) (THEODUR) tablet 300 mg: ORAL | @ 16:00:00 | NDC 50111045901

## 2013-09-13 MED ADMIN — ipratropium-albuterol (DUONEB) nebulizer solution 1 ampule: RESPIRATORY_TRACT | @ 15:00:00 | NDC 00487020101

## 2013-09-13 MED ADMIN — ipratropium-albuterol (DUONEB) nebulizer solution 1 ampule: RESPIRATORY_TRACT | @ 20:00:00 | NDC 00487020101

## 2013-09-13 MED ADMIN — levofloxacin (LEVAQUIN) tablet 500 mg: ORAL | @ 02:00:00 | NDC 68084048211

## 2013-09-13 MED ADMIN — ipratropium-albuterol (DUONEB) nebulizer solution 1 ampule: RESPIRATORY_TRACT | @ 12:00:00 | NDC 00487020101

## 2013-09-13 MED FILL — BANOPHEN 25 MG PO TABS: 25 MG | ORAL | Qty: 1

## 2013-09-13 MED FILL — LOVENOX 40 MG/0.4ML SC SOLN: 40 MG/0.4ML | SUBCUTANEOUS | Qty: 0.4

## 2013-09-13 MED FILL — PREDNISONE 20 MG PO TABS: 20 MG | ORAL | Qty: 1

## 2013-09-13 MED FILL — MUCINEX 600 MG PO TB12: 600 MG | ORAL | Qty: 1

## 2013-09-13 MED FILL — LEVOFLOXACIN 500 MG PO TABS: 500 MG | ORAL | Qty: 1

## 2013-09-13 MED FILL — THEOPHYLLINE ER 300 MG PO TB12: 300 MG | ORAL | Qty: 1

## 2013-09-13 MED FILL — LEVAQUIN 500 MG PO TABS: 500 MG | ORAL | Qty: 1

## 2013-09-13 NOTE — Discharge Summary (Signed)
Bruce, Aguirre                                       [id number]      ADMITTED:  09/09/2013                         DISCHARGED:  09/13/2013      ADMITTING PHYSICIANS:  Jonelle Sports. Bisel, D.O., Harrell Gave, D.O.;   and Madelyn Flavors, D.O.      CONSULTANTS:  None throughout the hospital stay.      PRIMARY CARE PHYSICIAN:  Primary care physician had previously been Bruce Aguirre, D.O.      ADMITTING DIAGNOSIS:  Acute hypoxic respiratory failure secondary to a COPD   exacerbation.      DISCHARGE DIAGNOSES:   1.    Acute hypoxic respiratory failure secondary to a COPD exacerbation.   2.    Community-acquired pneumonia with secondary severe sepsis.   3.    Hyperkalemia.      OPERATIONS AND PROCEDURES:  Patient underwent no operations or procedures.      OMT:  None given.      BRIEF HISTORY OF PRESENT ILLNESS:  Bruce Aguirre is a very polite and pleasant   57 year old Caucasian male who presented through [facility] emergency   department on September 09, 2013, with complaints of severe dyspnea that had   begun approximately 2-3 weeks prior to hospitalization.  The shortness of   breath had gotten progressively worse the day before admission.  His wife had   been sick previously with similar symptoms.  These symptoms included   coughing, wheezing and sinus congestion.  He saw his primary care physician   and was put on antibiotics and a prednisone taper that helped but did not   completely resolve the symptoms.  He has a known history of COPD and is a   reformed smoker.      On initial presentation to the emergency department he had conversational   dyspnea.  He did have some mild chest discomfort, and it was ultimately   determined he would need to be admitted to the hospital for further medical   management and evaluation.      PAST MEDICAL HISTORY:  Past medical history is significant for   non-oxygen-dependent COPD.      HOSPITAL COURSE:  Patient was admitted under the service of Drs. Ignacia Palma and Derris Millan on September 09, 2013.  Patient was started on broad-spectrum   antibiotic therapy.  Cultures were obtained, which were found to be negative.   Repeat chest x-ray was _____ on September 13, 2013, with complete resolution   of the pneumonia.  His COPD exacerbation was treated initially with   intravenous corticosteroids, with transition to oral corticosteroids prior to   discharge.  His respiratory medications were titrated accordingly and he was   deemed acceptable for discharge on September 13, 2013.      DISCHARGE PHYSICAL EXAMINATION:  In general, patient was awake, alert, in no   apparent distress.  HEENT was grossly negative.  Cardiovascular exam revealed   a regular rate and rhythm.  Lungs were clear to auscultation bilaterally.  No   wheezes, rhonchi or rales.  Abdomen was soft, nontender, nondistended.  No   masses palpated.  No hepatosplenomegaly.  No rebound or guarding elicited on  palpation.  Extremities were without edema bilaterally.  Peripheral pulses   intact, 2/4 in both upper and lower extremities.      DISCHARGE LABORATORY/IMAGING DATA:  White blood cell count 14.9, hemoglobin   13.8, hematocrit 39.9, with a platelet count of 255.  Sodium is 137,   potassium 5, chloride 99, CO2 is 32.  Glucose 81, BUN 14, creatinine 0.7.      DISCHARGE MEDICATIONS:  The patient will be sent home on the following   medications:  Levaquin 500 mg daily, theophylline 300 mg daily, prednisone 20   mg daily for 10 days.  He is to continue taking Spiriva 18 mcg daily,   Symbicort 2 puffs b.i.d., and albuterol by nebulization every 6 hours as   needed for wheezing.      DISPOSITION:  The patient will be sent home in stable condition.      FOLLOWUP:  Patient will follow up with Dr. Vedia Coffer on an outpatient basis to   establish care.         Dictated by:  Madelyn Flavors, D.O.                  KB / nmt   DD: 09/13/2013   04:29 P    DT: 09/13/2013 09:40 P   [id number]    [id number]   CC:   ROBERT E. BISEL, D.O.,FACOI         Munira Polson, D.O.

## 2013-09-13 NOTE — Discharge Summary (Signed)
Discharge Summary Dictated:  # [id number]    1. COPD exac  2. CAP     Mellody Dance Marye Eagen  4:29 PM  09/13/2013

## 2013-09-13 NOTE — Discharge Instructions (Signed)
Your information:  Name: Bruce Aguirre  DOB: 30-Mar-1956    Your instructions:    Discharge home    What to do after you leave the hospital:    Recommended diet: regular diet    Recommended activity: activity as tolerated    If you experience any of the following symptoms  Increase shortness of breath, chest pain discomfort, increased cough with increased sputum production, elevation of temperature, please follow up with Dr. Harlin Rain Dr. Vedia Coffer .    Follow-up with Dr. Harlin Rain in in one weeks.    The following personal items were collected during your admission and were returned to you:    Valuables  Dentures: None  Vision - Corrective Lenses: None  Hearing Aid: None  Jewelry: Watch  Body Piercings Removed: N/A  Clothing: Footwear, Pants, Shirt, Socks, Undergarments (Comment)  Were All Patient Medications Collected?: Not Applicable  Other Valuables: Cell phone  Valuables Given To: Patient  Patient approves for provider to speak to responsible person post operatively: No    Information obtained by:  By signing below, I understand that if any problems occur once I leave the hospital I am to contact Dr. Harlin Rain Dr. Vedia Coffer or return to the Emergency Room.  I understand and acknowledge receipt of the instructions indicated above.    Learning About COPD and How to Prevent Lung Infections  How do lung infections affect COPD?     Lung infections like pneumonia and acute bronchitis are common causes of COPD flare-ups. And people who have COPD are more likely to get these lung infections, especially if they smoke.  When you have COPD, it is important to know the symptoms of pneumonia and acute bronchitis and call your doctor if you have them. Symptoms include:   A cough that brings up more mucus than usual.   Fever.   Shortness of breath.  What can you do to prevent these infections?  Stay healthy    Get a flu shot every year.   Get a pneumococcal vaccine shot. If you have had one before, ask your doctor whether you need another  dose.   If you must be around people with colds or the flu, wash your hands often.   Do not smoke. This is the most important step you can take to prevent more damage to your lungs. If you need help quitting, talk to your doctor about stop-smoking programs and medicines. These can increase your chances of quitting for good.   Avoid secondhand smoke, air pollution, and high altitudes. Also avoid cold, dry air and hot, humid air. Stay at home with your windows closed when air pollution is bad.  Exercise and eat well    If your doctor recommends it, get more exercise. Walking is a good choice. Bit by bit, increase the amount you walk every day. Try for at least 30 minutes on most days of the week.   Eat regular, well-balanced meals. Eating right keeps your energy levels up and helps your body fight infection.   Get plenty of rest and sleep.  Follow-up care is a key part of your treatment and safety. Be sure to make and go to all appointments, and call your doctor if you are having problems. It's also a good idea to know your test results and keep a list of the medicines you take.   Where can you learn more?   Go to [email or web address] and sign in to your MyChart account. Enter [phone] in the Search Health  Information box to learn more about "Learning About COPD and How to Prevent Lung Infections."    If you do not have an account, please click on the "Sign Up Now" link.      2006-2014 [employer]. Care instructions adapted under license by [facility]. This care instruction is for use with your licensed healthcare professional. If you have questions about a medical condition or this instruction, always ask your healthcare professional. [employer] disclaims any warranty or liability for your use of this information.  Content Version: 10.1.311062; Current as of: Mar 10, 2012    COPD Exacerbation Plan: After Your Visit  Your Care Instructions  If you have  chronic obstructive pulmonary disease (COPD), your usual shortness of breath could suddenly get worse. You may start coughing more and have more mucus. This flare-up is called a COPD exacerbation (say "[id number]-[id number]").  A lung infection or air pollution could set off an exacerbation. Sometimes it can happen after a quick change in temperature or being around chemicals.  Work with your doctor to make a plan for dealing with an exacerbation. You can better manage it if you plan ahead.  Follow-up care is a key part of your treatment and safety. Be sure to make and go to all appointments, and call your doctor if you are having problems. It's also a good idea to know your test results and keep a list of the medicines you take.  How can you care for yourself at home?  During an exacerbation   Do not panic if you start to have one. Quick treatment at home may help you prevent serious breathing problems. If you have a COPD exacerbation plan that you developed with your doctor, follow it.   Take your medicines exactly as your doctor tells you.   Use your inhaler as directed by your doctor. If your symptoms do not get better after you use your medicine, have someone take you to the emergency room. Call an ambulance if necessary.   With inhaled medicines, a spacer or a nebulizer may help you get more medicine to your lungs. Ask your doctor or pharmacist how to use them properly. Practice using the spacer in front of a mirror before you have an exacerbation. This may help you get the medicine into your lungs quickly.   If your doctor has given you steroid pills, take them as directed.   Your doctor may have given you a prescription for antibiotics, which you are to fill if you need it. Call your doctor if you use the prescription.   Talk to your doctor if you have any problems with your medicine.  Preventing an exacerbation   Do not smoke. This is the most important step you can take to prevent more damage to your  lungs and prevent problems. If you already smoke, it is never too late to stop. If you need help quitting, talk to your doctor about stop-smoking programs and medicines. These can increase your chances of quitting for good.   Take your daily medicines as prescribed.   Avoid colds and flu.   Get a pneumococcal vaccine.   Get a flu vaccine each year, as soon as it is available. Ask those you live or work with to do the same, so they will not get the flu and infect you.   Try to stay away from people with colds or the flu.   Wash your hands often.   Avoid secondhand smoke; air pollution; cold,  dry air; hot, humid air; and high altitudes. Stay at home with your windows closed when air pollution is bad.   Learn breathing techniques for COPD, such as breathing through pursed lips. These techniques can help you breathe easier during an exacerbation.  When should you call for help?  Call 911 anytime you think you may need emergency care. For example, call if:   You have severe trouble breathing.   You have severe chest pain.  Call your doctor now or seek immediate medical care if:   You have new or worse shortness of breath.   You develop new chest pain.   You are coughing more deeply or more often, especially if you notice more mucus or a change in the color of your mucus.   You cough up blood.   You have new or increased swelling in your legs or belly.   You have a fever.  Watch closely for changes in your health, and be sure to contact your doctor if:   You use your antibiotic prescription.   Your symptoms are getting worse.   Where can you learn more?   Go to [email or web address] and sign in to your MyChart account. Enter [phone] in the Search Health Information box to learn more about "COPD Exacerbation Plan: After Your Visit."    If you do not have an account, please click on the "Sign Up Now" link.      2006-2014 [employer]. Care instructions adapted under license by  [facility]. This care instruction is for use with your licensed healthcare professional. If you have questions about a medical condition or this instruction, always ask your healthcare professional. [employer] disclaims any warranty or liability for your use of this information.  Content Version: 10.1.311062; Current as of: October 16, 2012

## 2013-09-14 LAB — CULTURE BLOOD #1: Blood Culture, Routine: 5

## 2013-09-14 LAB — CULTURE, BLOOD 2: Culture, Blood 2: 5
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
## 2013-11-28 LAB — BLOOD GAS, ARTERIAL
B.E.: 4.1 mmol/L — ABNORMAL HIGH (ref <=3.0)
Date Analyzed: 20150128
Date Of Collection: 20150128
HCO3: 29.1 mmol/L — ABNORMAL HIGH (ref 22.0–26.0)
Lab: 15941
O2 Sat: 95.2 % (ref 92.0–98.5)
Operator ID: 105301
PCO2: 44.6 mmHg (ref 35.0–45.0)
PO2: 74.3 mmHg (ref 60.0–100.0)
Pt Temp: 37 C
Time Analyzed: 1733
Time Collected: 1627
pH, Blood Gas: 7.432 (ref 7.350–7.450)

## 2016-03-19 ENCOUNTER — Ambulatory Visit: Admit: 2016-03-19

## 2016-03-19 ENCOUNTER — Encounter

## 2016-03-19 ENCOUNTER — Inpatient Hospital Stay

## 2016-03-19 DIAGNOSIS — J449 Chronic obstructive pulmonary disease, unspecified: Secondary | ICD-10-CM

## 2016-03-19 LAB — CBC WITH AUTO DIFFERENTIAL
Basophils %: 0.3 % (ref 0.0–2.0)
Basophils Absolute: 0.03 E9/L (ref 0.00–0.20)
Eosinophils %: 0.4 % (ref 0.0–6.0)
Eosinophils Absolute: 0.04 E9/L — ABNORMAL LOW (ref 0.05–0.50)
Hematocrit: 38.2 % (ref 37.0–54.0)
Hemoglobin: 14.6 g/dL (ref 12.5–16.5)
Immature Granulocytes #: 0.06 E9/L
Immature Granulocytes %: 0.5 % (ref 0.0–5.0)
Lymphocytes %: 13.7 % — ABNORMAL LOW (ref 20.0–42.0)
Lymphocytes Absolute: 1.5 E9/L (ref 1.50–4.00)
MCH: 33.3 pg (ref 26.0–35.0)
MCHC: 38.2 % — ABNORMAL HIGH (ref 32.0–34.5)
MCV: 87 fL (ref 80.0–99.9)
MPV: 9.3 fL (ref 7.0–12.0)
Monocytes %: 7.9 % (ref 2.0–12.0)
Monocytes Absolute: 0.87 E9/L (ref 0.10–0.95)
Neutrophils %: 77.2 % (ref 43.0–80.0)
Neutrophils Absolute: 8.47 E9/L — ABNORMAL HIGH (ref 1.80–7.30)
Platelets: 290 E9/L (ref 130–450)
RBC: 4.39 E12/L (ref 3.80–5.80)
RDW: 14.1 fL (ref 11.5–15.0)
WBC: 11 E9/L (ref 4.5–11.5)

## 2016-03-19 LAB — BASIC METABOLIC PANEL
Anion Gap: 12 mmol/L (ref 7–16)
BUN: 11 mg/dL (ref 6–20)
CO2: 29 mmol/L (ref 22–29)
Calcium: 9.2 mg/dL (ref 8.6–10.2)
Chloride: 99 mmol/L (ref 98–107)
Creatinine: 0.6 mg/dL — ABNORMAL LOW (ref 0.7–1.2)
GFR African American: >60
GFR Non-African American: >60 mL/min/{1.73_m2} (ref >=60)
Glucose: 108 mg/dL (ref 74–109)
Potassium: 4.1 mmol/L (ref 3.5–5.0)
Sodium: 140 mmol/L (ref 132–146)

## 2016-04-28 ENCOUNTER — Encounter

## 2016-05-26 ENCOUNTER — Encounter

## 2016-06-23 NOTE — Telephone Encounter (Signed)
Bruce Aguirre called stating that he did not need an appointment at this time and he had our # and he would call if he needed anything.

## 2016-07-16 ENCOUNTER — Encounter: Attending: Critical Care Medicine

## 2016-09-09 ENCOUNTER — Institutional Professional Consult (permissible substitution): Admit: 2016-09-09 | Discharge: 2016-09-09 | Payer: BLUE CROSS/BLUE SHIELD | Attending: Critical Care Medicine

## 2016-09-09 DIAGNOSIS — J449 Chronic obstructive pulmonary disease, unspecified: Secondary | ICD-10-CM

## 2016-09-09 MED ORDER — ROFLUMILAST 500 MCG PO TABS
500 MCG | ORAL_TABLET | Freq: Every day | ORAL | 7 refills | Status: DC
Start: 2016-09-09 — End: 2017-05-31

## 2016-09-09 MED ORDER — SULFAMETHOXAZOLE-TRIMETHOPRIM 800-160 MG PO TABS
800-160 MG | ORAL_TABLET | Freq: Every day | ORAL | 7 refills | Status: AC
Start: 2016-09-09 — End: ?

## 2016-09-09 MED ORDER — FLUTICASONE FUROATE-VILANTEROL 200-25 MCG/INH IN AEPB
200-25 MCG/INH | Freq: Every day | RESPIRATORY_TRACT | 7 refills | Status: DC
Start: 2016-09-09 — End: 2017-05-26

## 2016-09-09 MED ORDER — TIOTROPIUM BROMIDE MONOHYDRATE 18 MCG IN CAPS
18 MCG | ORAL_CAPSULE | Freq: Every day | RESPIRATORY_TRACT | 7 refills | Status: DC
Start: 2016-09-09 — End: 2016-11-29
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
## 2016-09-09 NOTE — Patient Instructions (Addendum)
Patient Education        Learning About COPD and Clearing Your Lungs  How does clearing your lungs affect COPD?    When you have COPD, you may have too much mucus in your lungs. Learning to clear your lungs may help you save energy and improves your breathing. It may also help prevent lung infections.  There are three ways to clear your lungs:  ?? Controlled coughing  ?? Postural drainage  ?? Chest percussion  How do you do controlled coughing?  Coughing is how your body tries to get rid of mucus. But the kind of coughing you cannot control makes things worse. It causes your airways to close. It also traps the mucus in your lungs.  Controlled coughing comes from deep in your lungs. It loosens mucus and moves it though your airways. It is best to do it after you use your inhaler or other medicine.  1. Sit on the edge of a chair. Keep both feet on the floor.  2. Lean forward a little. Relax.  3. Breathe in slowly through your nose. Fold your arms over your belly.  4. Lean forward as you breathe out. Push your arms against your belly.  5. Cough 2 or 3 times as you exhale with your mouth slightly open. Make the coughs short and sharp. Push on your belly with your arms as you cough. The first cough brings the mucus through the lung airways. The next coughs bring it up and out.  6. Inhale again, but do it slowly and gently through your nose. Do not take quick or deep breaths through your mouth. It can block the mucus coming out of the lungs. It also can cause uncontrolled coughing.  7. Rest, and repeat if you need to.  How do you do postural drainage?  Postural drainage means lying down in different positions to help drain mucus from your lungs.  Hold each position for 5 minutes. Do it about 30 minutes after you use your inhaler. Make sure you have an empty stomach. If you need to cough, sit up and do controlled coughing.  1. To drain the front of your lungs: Make sure your chest is lower than your hips. Put two pillows  under your hips. Use a small pillow under your head. Keep your arms at your sides. Then follow these instructions for breathing:  ?? Breathe in: With one hand on your belly and the other on your chest, breathe in. Push your belly out as far as possible. You should be able to feel the hand on your belly move out, while the hand on your chest should not move.  ?? Breathe out: When you breathe out, you should be able to feel the hand on your belly move in. This is called diaphragmatic breathing. You will use it in the other drainage positions too.  2. To drain the sides of your lungs: Place two or three pillows under your hips. Use a small pillow under your head. Make sure your chest is lower than your hips. Use diaphragmatic breathing. After 5 or 10 minutes, switch sides.  3. To drain the back of your lungs: Place two or three pillows under your hips. Use a small pillow under your head. Kneel over the pillows. Place your arms by your head. Use diaphragmatic breathing.  How do you do chest percussion?  Chest percussion means that you lightly tap your chest and back. The tapping loosens the mucus in your lungs.  ??   Cup your hand, and lightly tap your chest and back.  ?? Ask your doctor where the best spots are to tap. Avoid your spine and breastbone.  ?? It may be easier to have someone do the tapping for you.  Follow-up care is a key part of your treatment and safety. Be sure to make and go to all appointments, and call your doctor if you are having problems. It's also a good idea to know your test results and keep a list of the medicines you take.  Where can you learn more?  Go to [email or web address] and sign in to your MyChart account. Enter Q948 in the Search Health Information box to learn more about "Learning About COPD and Clearing Your Lungs."     If you do not have an account, please click on the "Sign Up Now" link.  Current as of: January 24, 2016  Content Version: 11.3  ?? 2006-2017 [employer]wise,  Incorporated. Care instructions adapted under license by  Health. If you have questions about a medical condition or this instruction, always ask your healthcare professional. Healthwise, Incorporated disclaims any warranty or liability for your use of this information.

## 2016-09-10 ENCOUNTER — Inpatient Hospital Stay: Attending: Critical Care Medicine

## 2016-09-10 LAB — EOSINOPHIL COUNT: Eosin Ct: 10 /uL — ABNORMAL LOW (ref 50–250)

## 2016-09-12 LAB — VITAMIN D 1,25 DIHYDROXY: Vit D, 1,25-Dihydroxy: 78.2 pg/mL (ref 19.9–79.3)

## 2016-09-16 LAB — MISCELLANEOUS SENDOUT

## 2016-09-22 ENCOUNTER — Encounter

## 2016-09-22 ENCOUNTER — Inpatient Hospital Stay: Admit: 2016-09-22 | Attending: Critical Care Medicine

## 2016-09-22 ENCOUNTER — Encounter: Admit: 2016-09-22 | Discharge: 2016-09-22

## 2016-09-22 DIAGNOSIS — Z7952 Long term (current) use of systemic steroids: Secondary | ICD-10-CM

## 2016-09-22 LAB — ECHOCARDIOGRAM COMPLETE 2D W DOPPLER W COLOR: Left Ventricular Ejection Fraction: 65

## 2016-09-27 NOTE — Telephone Encounter (Signed)
Called patient to discuss results of bone density test as Dr. Cheryll CockayneApostolis wants him referred to rheumatology but no answer, left message.

## 2016-11-29 ENCOUNTER — Telehealth

## 2016-11-29 MED ORDER — TIOTROPIUM BROMIDE MONOHYDRATE 2.5 MCG/ACT IN AERS
2.5 MCG/ACT | Freq: Every day | RESPIRATORY_TRACT | 5 refills | Status: DC
Start: 2016-11-29 — End: 2017-09-14

## 2016-11-29 NOTE — Telephone Encounter (Signed)
Bruce Aguirre called stating that he now has [name] and wants to go back to the spiriva respimat.  Please send new script to:  [facility], [place] - [address] Demetrius Charity [phone] - F [phone]

## 2016-11-29 NOTE — Telephone Encounter (Signed)
sent 

## 2017-03-21 ENCOUNTER — Encounter: Attending: Critical Care Medicine

## 2017-05-26 ENCOUNTER — Telehealth

## 2017-05-26 MED ORDER — SYMBICORT 160-4.5 MCG/ACT IN AERO
Freq: Two times a day (BID) | RESPIRATORY_TRACT | 6 refills | Status: DC
Start: 2017-05-26 — End: 2020-02-26

## 2017-05-26 NOTE — Telephone Encounter (Signed)
Spoke with Bruce Aguirre, adamantly refusing breo, called pharmacy symbicort ordered.

## 2017-05-26 NOTE — Telephone Encounter (Signed)
Ezell called very irate about medication problems. Said that he was told by the pharmacy, who contacted our office to switch his medications, to stop the symbicort and switch to the breo.  Very angry that this was done without him knowing and said if he is stopping anything it will be the breo. Please call [phone]

## 2017-05-31 ENCOUNTER — Ambulatory Visit: Admit: 2017-05-31 | Discharge: 2017-05-31 | Payer: PRIVATE HEALTH INSURANCE | Attending: Critical Care Medicine

## 2017-05-31 DIAGNOSIS — J432 Centrilobular emphysema: Secondary | ICD-10-CM

## 2017-05-31 MED ORDER — ROFLUMILAST 500 MCG PO TABS
500 MCG | ORAL_TABLET | Freq: Every day | ORAL | 5 refills | Status: DC
Start: 2017-05-31 — End: 2020-02-26

## 2017-05-31 NOTE — Patient Instructions (Addendum)
Patient Education        Learning About COPD and Clearing Your Lungs  How does clearing your lungs affect COPD?    When you have COPD, you may have too much mucus in your lungs. Learning to clear your lungs may help you save energy and improves your breathing. It may also help prevent lung infections.  There are three ways to clear your lungs:   Controlled coughing   Postural drainage   Chest percussion  How do you do controlled coughing?  Coughing is how your body tries to get rid of mucus. But the kind of coughing you cannot control makes things worse. It causes your airways to close. It also traps the mucus in your lungs.  Controlled coughing comes from deep in your lungs. It loosens mucus and moves it though your airways. It is best to do it after you use your inhaler or other medicine.  1. Sit on the edge of a chair. Keep both feet on the floor.  2. Lean forward a little. Relax.  3. Breathe in slowly through your nose. Fold your arms over your belly.  4. Lean forward as you breathe out. Push your arms against your belly.  5. Cough 2 or 3 times as you exhale with your mouth slightly open. Make the coughs short and sharp. Push on your belly with your arms as you cough. The first cough brings the mucus through the lung airways. The next coughs bring it up and out.  6. Inhale again, but do it slowly and gently through your nose. Do not take quick or deep breaths through your mouth. It can block the mucus coming out of the lungs. It also can cause uncontrolled coughing.  7. Rest, and repeat if you need to.  How do you do postural drainage?  Postural drainage means lying down in different positions to help drain mucus from your lungs.  Hold each position for 5 minutes. Do it about 30 minutes after you use your inhaler. Make sure you have an empty stomach. If you need to cough, sit up and do controlled coughing.  1. To drain the front of your lungs: Make sure your chest is lower than your hips. Put two pillows  under your hips. Use a small pillow under your head. Keep your arms at your sides. Then follow these instructions for breathing:   Breathe in: With one hand on your belly and the other on your chest, breathe in. Push your belly out as far as possible. You should be able to feel the hand on your belly move out, while the hand on your chest should not move.   Breathe out: When you breathe out, you should be able to feel the hand on your belly move in. This is called diaphragmatic breathing. You will use it in the other drainage positions too.  2. To drain the sides of your lungs: Place two or three pillows under your hips. Use a small pillow under your head. Make sure your chest is lower than your hips. Use diaphragmatic breathing. After 5 or 10 minutes, switch sides.  3. To drain the back of your lungs: Place two or three pillows under your hips. Use a small pillow under your head. Kneel over the pillows. Place your arms by your head. Use diaphragmatic breathing.  How do you do chest percussion?  Chest percussion means that you lightly tap your chest and back. The tapping loosens the mucus in your lungs.     Cup your hand, and lightly tap your chest and back.   Ask your doctor where the best spots are to tap. Avoid your spine and breastbone.   It may be easier to have someone do the tapping for you.  Follow-up care is a key part of your treatment and safety. Be sure to make and go to all appointments, and call your doctor if you are having problems. It's also a good idea to know your test results and keep a list of the medicines you take.  Where can you learn more?  Go to [email or web address] and sign in to your MyChart account. Enter Q948 in the Search Health Information box to learn more about "Learning About COPD and Clearing Your Lungs."     If you do not have an account, please click on the "Sign Up Now" link.  Current as of: October 06, 2016  Content Version: 11.6   2006-2018 Healthwise,  Incorporated. Care instructions adapted under license by [facility]. If you have questions about a medical condition or this instruction, always ask your healthcare professional. Healthwise, Incorporated disclaims any warranty or liability for your use of this information.

## 2017-05-31 NOTE — Progress Notes (Addendum)
[facility]                  [address]  [address] [address]  [place], [place]   [id number]    [phone]    Fax  [phone]     Dara LordsAlan J. Cropp, M.D., [facility]  Marlis EdelsonBryan Veynovich, D.O., F.A.C.O.I., [facility]  Alesia BandaGhazaleh Bigdeli, M.D.,   Jules SchickMichael Shaira Sova, M.D., [facility]  Arnoldo HookerAshlee Russo, M.D.            Chief Complaint  Chief Complaint   Patient presents with   . COPD     ECHO   . Asthma        History of Present Illness  Bruce Aguirre is a 61 y.o. male in no apparent distress.  Patient is alert and oriented x 3. He is currently on 3 lpm at rest and 4 lpm with exertion. He is using breo and spiriva. He is still taking 10 mg prednisone daily. He stopped taking daliresp. [place] He has not had any need for antibiotics.  Patient denies dyspnea at rest or with increased activity at home. Patient can walk a flight of steps without feeling short of breath. Patient denies cough, chest palpitations or chest tightness or pedal edema. Patient denies wheezing.  Patient denies fever, chills, hemoptysis or night sweats. Patient denies heartburn.    Allergies  Allergies   Allergen Reactions   . Nutritional Supplements Anaphylaxis       Past Medical History   Past Medical History:   Diagnosis Date   . Asthma    . COPD (chronic obstructive pulmonary disease) (HCC)    . Former smoker        Medications (labs have all been reviewed)  Current Outpatient Prescriptions   Medication Sig Dispense Refill   . Multiple Vitamins-Minerals (THERAPEUTIC MULTIVITAMIN-MINERALS) tablet Take 1 tablet by mouth daily     . SYMBICORT 160-4.5 MCG/ACT AERO Inhale 2 puffs into the lungs 2 times daily 1 Inhaler 6   . tiotropium (SPIRIVA RESPIMAT) 2.5 MCG/ACT AERS inhaler Inhale 2 puffs into the lungs daily 1 Inhaler 5   . albuterol-ipratropium (COMBIVENT RESPIMAT) 20-100 MCG/ACT AERS inhaler      . predniSONE (DELTASONE) 20 MG tablet Take 20 mg by mouth daily     . sulfamethoxazole-trimethoprim (BACTRIM DS) 800-160  MG per tablet Take 1 tablet by mouth daily 30 tablet 7   . Roflumilast (DALIRESP) 500 MCG tablet Take 500 mcg by mouth daily 30 tablet 7   . theophylline CR, 12 hour, (THEODUR) 300 MG CR tablet Take 1 tablet by mouth daily. 60 tablet 0   . albuterol (ACCUNEB) 0.63 MG/3ML nebulizer solution Take 1 ampule by nebulization every 6 hours as needed for Wheezing or Shortness of Breath.       No current facility-administered medications for this visit.        Immunizations  Immunization status: up to date and documented.    Surgical History  Past Surgical History:   Procedure Laterality Date   . APPENDECTOMY     . TONSILLECTOMY         Social History  Social History     Social History   . Marital status: Divorced     Spouse name: N/A   . Number of children: N/A   . Years of education: N/A     Occupational History   . disability       Social History Main Topics   .  Smoking status: Former Smoker     Packs/day: 1.00     Years: 20.00     Types: Cigarettes   . Smokeless tobacco: Never Used   . Alcohol use No   . Drug use: No   . Sexual activity: Not on file     Other Topics Concern   . Not on file     Social History Narrative   . No narrative on file       Family History  family history includes COPD in his mother; Heart Disease in his father.    Review of Systems  The patient has no problems with hearing. There is no problem with seeing, smelling, or tasting. There is no history of seizure, syncope, or stroke. There is no history of hemoptysis, hematemesis, hematochezia, hematuria, or dysuria. Other than as noted above, the review of systems is unremarkable.     Objective   BP (!) 145/93 (Site: Left Arm)   Pulse 93   Temp 97.1 F (36.2 C) (Tympanic)   Resp 18   Ht 6\' 2"  (1.88 m)   Wt 188 lb (85.3 kg)   SpO2 95%   BMI 24.14 kg/m  Body mass index is 24.14 kg/m.    PHYSICAL EXAM  General Appearance:   The patient is a well developed and nourished male who   is awake, alert and in no acute distress.    Head:     Normocephalic, without obvious abnormality, atraumatic   Eyes:    PERRL, conjunctiva/corneas clear, both eyes.       Nose:  Nares normal, mucosa normal   Throat:  Lips, mucosa, and tongue normal.   Neck:  Supple, symmetrical, trachea midline, no adenopathy;     thyroid:  no enlargement/tenderness/nodules or JVD.         Lungs:   Resonant. Breath sounds, diminished breath sounds bilateral. Respirations   unlabored. Symmetrical expansion. Normal excursion.        Heart:   Regular rate and rhythm, S1 and S2 normal, no murmur, rub   or gallop.         Abdomen:    Soft, non-tender, bowel sounds active all four quadrants,     no masses, no organomegaly, no bruit.           Extremities:  Extremities normal, no cyanosis, clubbing, or edema.       Skin:   Warm and dry. Skin color normal.  No rashes,    bleeding,  or lesions.          SpO2 Readings from Last 1 Encounters:   05/31/17 95%            DATA (in office testing).  Pulse oximetry testing on room air at res was 87%.  Pulse oximetry on oxygen at 3 lpm at rest was 95%.  Pulse oximetry on 3 lpm with activity was 89%  Pulse oximetry on 4 lpm with activity was 91%          ASSESSMENT   Diagnosis Orders   1. Centrilobular emphysema (HCC)     2. Chronic respiratory failure with hypoxia, on home O2 therapy (HCC)     3. Cor pulmonale (HCC)     4. Former smoker     5. Osteopenia, unspecified location     6. Current chronic use of systemic steroids         PLAN  Triple inhaler therapy   Albuterol as needed   Wean off steroids even  though Mr. Docken is reluctant.   Start daliresp  Calcium and vitamin D supplement recommended.   Referral to Dr. Lidia Collum for pulmonary hypertension   Refusing pulmonary rehab   Refusing PSG  Oxygen at 3 lpm at rest and 4 lpm with activity    Follow Up  Return in about 3 months (around 08/31/2017).  Jules Schick, M.D.   Pulmonary/Critical Care Medicine   05/31/2017  5:01 PM

## 2017-06-03 ENCOUNTER — Telehealth

## 2017-06-03 NOTE — Telephone Encounter (Signed)
Bruce Aguirre [phone] his O2 concentrator broke last night and he needs orders sent to [facility] in [place] for a new one. He said he is on 6L. He is using his portable now. He would like a call when it is sent so he can just go and pick it up instead of waiting for them to deliver it.

## 2017-06-03 NOTE — Telephone Encounter (Signed)
Information faxed [facility]. Patient made aware.

## 2017-06-28 ENCOUNTER — Ambulatory Visit: Admit: 2017-06-28 | Discharge: 2017-06-28 | Payer: MEDICARE | Attending: Cardiovascular Disease

## 2017-06-28 DIAGNOSIS — I519 Heart disease, unspecified: Secondary | ICD-10-CM

## 2017-06-28 NOTE — Progress Notes (Signed)
Bruce Aguirre 09-03-1956 [id number]     Initial visit  He is unsure why he is here other than Dr Cheryll Cockayne told him to come.   Says he knows he has bad lungs.   Initial intake done, dr will come in and review case with you and explain her recommendations/finding/needed testings.    Dr's:  Dr Cheryll Cockayne referral, , Reatha Armour (PCP office in [place]),     In [place] needs to establish with PCP.     Has as needed antibiotics/inhalers/nebulizers/steroids.   Self manages when he takes the meds.     Goes to [place] 6 month out of the year, exercises almost daily when in [place].  No exercise formal in [place] no gym where I live but I keep very active.  Quit tobacco.     Initial  (6 min walk test) done today for DR Kassis-George on 3 liters 02, portable carry tank.  He wore very heavy shoes. He will wear athletic shoes to each visit after today.  Walked total 970 feet in 6 min wearing 3 liters. He had steady pace and did not stop. Says he fells he is breathing better after he walked.     Ruthe Mannan, RN 06/28/2017 12:12 PM

## 2017-06-28 NOTE — Progress Notes (Signed)
Advanced Heart Failure and Pulmonary Hypertension Clinic  New Visit        Reason for Visit: establishment of care for heart failure, possible pulmonary hypertension        Referring Physician: Dorita Fray, M.D. (pulm/CC[facility])  Primary Cardiologist: Lilia Argue, M.D. (Advanced HF and [facility])        History of Present Illness:   [name] is a 61 year old with known severe COPD, FEV 1 13%, long term oxygen therapy, systemic hypertension, diastolic dysfunction on echocardiography, elevated aortic valve gradients consistent with at least moderate AS with sub optimal imaging, who presents today in referral for further investigation of pulmonary hypertension given right ventricular hypertrophy demonstrated on echocardiogram in November 2017.       Notably, he has been evaluated in the past at the [facility] for possible lung transplantation. He was last seen January 07, 2015, and at that time he had a significant decline in his 6 minute walk distance which was concerning regarding his candidacy and overall functional status going into possible transplantation. At the time he had not yet been enrolled in a formal pulmonary rehabilitation program. Recommendations were for him to enroll to achieve a goal of 700 feet, and then continuation of his transplantation evaluation.       He states that he was concerned with the out of pocket expenses incurred with his trips to the [facility], and this seems to have been a deterrent for return there. He has not been back since despite ongoing decline in functional capacity. He reports "exercising" frequently when at his home in [place] 6 months out of the year, but when in [place] he remains overall sedentary.       He remains on 3 L nasal cannula which is stable since the last time he was evaluated at [facility] (at the time 2-3 l/min). He ambulated a total of 970 feet today with the 6 minute walk test at a steady pace and did not stop. See the formal results below  under diagnostics. He has not been hospitalized in the past several years. He denies any signs or symptoms of worsening COPD though last set of PFTs as mentioned were at [facility] with FEV1 13%. Also see below for resultts.      He specifically denies dyspnea with exertion, shortness of breath, or decline in overall functional capacity. He denies orthopnea, PND, nocturnal cough or hemoptysis. He denies abdominal distention or bloating, early satiety, anorexia/change in appetite, unintentional weight loss. He does not lower extremity edema. He denies exertional lightheadedness. He denies palpitations, syncope or near syncope.     Review of systems is negative for chest pain, pressure, discomfort. When ambulating on an incline, he/she reports/denies leg claudication. History is negative for neurological symptoms including transient loss of vision, asymmetric weakness, aphasia, dysphasia, numbness, tingling.         Pulmonary Hypertension Risk Factors Present Absent Comments   Male gender   xx    Rheumatologic disease: scleroderma/systemic sclerosis/rheumatoid arthritis, SLE  xx    Drugs or toxins including:  Aminorex, fenfluramine, dexfenfluramine, toxic rapeseed oil, benfluorex, SSRIs, SNRIs, amphetamines, tyrosine kinase inhibitors, L tryptophan, methamphetamines, cocaine, phenylpropanolamine, St. John's wort, interferon alpha and beta, mytomycine C, cyclophosphamide  xx    Family history, known BMPR2 mutation  xx    HIV or risk of including concomitant hepatitis, IVDU, blood transfusions  xx    Cirrhosis or risk factors for cirrhosis including hepatitis, obesity, alcoholism, RHF due to other reasons,  blood transfusions  xx    Congenital heart disease (eisenmonger, systemic to pulmonic shunting, and after correction)  xx    HIV risk factors  xx    Alcohol use:  90 g EtOH > 5 yrs  Heavy drinking >10 yrs  xx    Isolated low DLCO    Severe COPD, not isolated   History cancer/chemotherapy/radiation: tyrosine kinase  inhibitors, cyclophosphamide, mytomycin c  xx    LHD, LVH, elevated LAVI, valvular disease xx     Extensive tobacco use xx     Alpha 1 anti trypsin      COPD, supplemental O2,chronic hypoxia xx     OSA, or symptoms of  xx    Travel to [place] or [place] with exposure to hepatitis A  xx    Sickle cell disease  xx    Congenital pulmonary artery stenosis    xx    History DVTs, PEs  xx    Chronic anemia, myeloproliferative disorder, history of splenectomy  xx    Sarcoidosis, neurofibromatosis  xx    Gauchers, thyroid disease, glycogen storage disease  xx    Chronic renal failure  xx    Fibrosing mediastinitis  xx        Heart Failure Risk Factors Present Absent Comments   Systemic hypertension      Diabetes mellitus      Metabolic syndrome:  Waist size,TGs,HDL,IFG,HTN      CAD,CVD,PVD      Thyroid disease: Hyperthyroidism; Hypothyroidism      Arrhythmia       Family history:  >2 relatives with DCM  SCD      Recent viral illness      HIV risk factors      Alcohol use:  90 g EtOH > 5 yrs  Heavy drinking >10 yrs      Performance enhancing drugs: ephedra,anabolic steroids, methamphetamines,methylphenidate, cocaine      History cancer/chemotherapy/radiation: anthracyclines, dose and duration, iron-chelating agents; trastuzumab; cyclophosphamide; taxoids; 5FU; interferons; mitomycin-C      Rheumatologic disease: pericarditis history; myocarditis history; chloroquine use;scleroderma/RA/SLE      Pregnancy: GXPX      Thiamine deficiency: anorexia nervosa,pregnancy,AIDS,alcoholism      L-carnitine deficiency, skeletal myopathy      Exposure to hypersensitivity inducing agents/presence of eosinophilia: sulfonamides, methyldopa, amphotericin B,streptomycin      Travel to [place] or [place]      Joint replacement, exposure to cobalt      Hemochromatosis risk factors:  Familial,DM/hepatomegaly,beta thalassemia/[place] descent      Pyschiatric history: clozapine use      Sarcoidosis      Amyloidosis      Stress induced       Acromegaly                Past medical, surgical, family and social histories have been reviewed. Any changes in the past medical history, social history or family history have been made and are reflected in the electronic medical record.         Patient Active Problem List    Diagnosis Date Noted   ??? COPD exacerbation ([place]) 09/09/2013     Priority: High   ??? Acute respiratory failure with hypoxia ([place]) 09/09/2013   ??? Severe sepsis without septic shock ([place]) 09/09/2013   ??? CAP (community acquired pneumonia) 09/09/2013   ??? Hyperkalemia 09/09/2013   ??? COPD (chronic obstructive pulmonary disease) ([place])    ??? Asthma    ??? Former smoker  Past Medical History:   Diagnosis Date   ??? Asthma    ??? COPD (chronic obstructive pulmonary disease) ([place])    ??? Former smoker            Past Surgical History:   Procedure Laterality Date   ??? APPENDECTOMY     ??? TONSILLECTOMY         Family History   Problem Relation Age of Onset   ??? COPD Mother    ??? Heart Disease Father      Social History     Social History   ??? Marital status: Divorced     Spouse name: N/A   ??? Number of children: N/A   ??? Years of education: N/A     Occupational History   ??? disability       Social History Main Topics   ??? Smoking status: Former Smoker     Packs/day: 1.00     Years: 20.00     Types: Cigarettes     Quit date: 02/11/2009   ??? Smokeless tobacco: Never Used   ??? Alcohol use 0.6 oz/week     1 Cans of beer per week      Comment:  uses beer 2-3 every other week .  rare coffee use for caffeine.   ??? Drug use: No   ??? Sexual activity: Not Currently     Partners: Female     Other Topics Concern   ??? 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         Divorced. One child, no grandchildren. Spend 6 months out of the year in [place] where he remains fairly active. Admits to sedentary lifestyle when in [place].           Allergies   Allergen Reactions   ??? Nutritional Supplements Anaphylaxis         Outpatient Prescriptions Marked as Taking for the 06/28/17 encounter (Office Visit) with Lilia Argue, MD   Medication Sig Dispense Refill   ??? Multiple Vitamins-Minerals (THERAPEUTIC MULTIVITAMIN-MINERALS) tablet Take 1 tablet by mouth daily     ??? Roflumilast (DALIRESP) 500 MCG tablet Take 500 mcg by mouth daily (Patient taking differently: Take 500 mcg by mouth as needed ) 30 tablet 5   ??? SYMBICORT 160-4.5 MCG/ACT AERO Inhale 2 puffs into the lungs 2 times daily (Patient taking differently: Inhale 2 puffs into the lungs daily ) 1 Inhaler 6   ??? tiotropium (SPIRIVA RESPIMAT) 2.5 MCG/ACT AERS inhaler Inhale 2 puffs into the lungs daily (Patient taking differently: Inhale 1 puff into the lungs daily ) 1 Inhaler 5   ??? albuterol-ipratropium (COMBIVENT RESPIMAT) 20-100 MCG/ACT AERS inhaler Inhale 1 puff into the lungs every 6 hours as needed      ??? predniSONE (DELTASONE) 20 MG tablet Take 20 mg by mouth daily      ??? sulfamethoxazole-trimethoprim (BACTRIM DS) 800-160 MG per tablet Take 1 tablet by mouth daily (Patient taking differently: Take 1 tablet by mouth as needed ) 30 tablet 7   ??? theophylline CR, 12 hour, (THEODUR) 300 MG CR tablet Take 1 tablet by mouth daily. (Patient taking differently: Take 300 mg by mouth as needed ) 60 tablet 0   ??? albuterol (ACCUNEB) 0.63 MG/3ML nebulizer solution Take 1 ampule by nebulization every 6 hours as  needed for Wheezing or Shortness of Breath.             PAH Guideline directed medical therapy:  PDE-5i: No  ERA:  No  Prostacyclin:  No  Oral NA IV  NA Subcutaneous  NA   Prostacyclin receptor agonist: No  Soluble guanylate cyclase stimulator: No  Spironolactone: No  Digoxin: No  Calcium channel blocker: No  Referral to psychiatry/therapy/counseling: No                Review of Systems:   Cardiac: As per HPI  General: No fever, chills, rigors  Pulmonary: As per HPI  HEENT: No visual  disturbances, difficult swallowing  GI: No nausea, vomiting, abdominal pain  GU: No dysuria or hematuria  Endocrine: No thyroid disease or diabetes  Musculoskeletal: MAE x 4, no focal motor deficits  Skin: Intact, no rashes  Neuro/Psych: No headache or seizures          Weights:  Wt Readings from Last 3 Encounters:   06/28/17 189 lb 3.2 oz (85.8 kg)   05/31/17 188 lb (85.3 kg)   09/09/16 200 lb (90.7 kg)     There were no vitals filed for this visit.  Physical Examination:   BP 110/60 (Site: Right Arm, Position: Sitting, Cuff Size: Medium Adult) Comment: denies dizzy/lightheaded   Pulse 76    Temp 98.7 ??F (37.1 ??C) (Oral)    Resp 16    Ht '6\' 2"'  (1.88 m)    Wt 189 lb 3.2 oz (85.8 kg)    SpO2 96% Comment: 3 liters 02   BMI 24.29 kg/m??   CONSTITUTIONAL: Alert and oriented times 3, no acute distress and cooperative to examination with proper mood and affect. Appears stated age.   SKIN: Skin color, texture, turgor normal. No rashes or lesions.  LYMPH: no cervical nodes, no inguinal nodes  HEENT: Head is normocephalic, atraumatic. EOMI, PERRLA. Nasal cannula in place.  NECK: Supple, symmetrical, trachea midline, no adenopathy, thyroid symmetric, not enlarged and no tenderness, skin normal.  CHEST/LUNGS: chest symmetric with normal A/P diameter, notable tachypnea, breathlessness with long sentences, otherwise lungs clear to auscultation without wheezes, rales or rhonchi. No accessory muscle use.  CARDIOVASCULAR: Heart sounds are normal.  Regular rate and rhythm without murmur, gallop or rub. Normal S1 and S2. No loud P2.   ABDOMEN: Normal shape. No and Laparoscopic scar(s) present. Normal bowel sounds.  No bruits. soft, nondistended, no masses or organomegaly. no evidence of hernia. Percussion: Normal without hepatosplenomegally. Tenderness: absent. No HJR.   RECTAL: deferred, not clinically indicated  NEUROLOGIC: There are no focalizing motor or sensory deficits. CN II-XII are grossly intact.  EXTREMITIES: no cyanosis, no  clubbing and no edema, warm well perfused.          All the following diagnostics were personally reviewed and interpreted by me.       Lab Data -   ABG  01/07/2015 05/27/2014   7.386 7.407   48.7 (H) 43.0   69.5 (L) 74.8 (L)   28.5 26.5   3.0 2.0   92.0 (L) 92.3 (L)   3.0 3.5   0.7 0.8   OPERATOR OPERATOR   21.0 ??          09/12/2013 05:35 09/13/2013 05:50 11/28/2013 16:27 03/19/2016 16:50   Sodium 138 137  140   Potassium 4.6 5.0  4.1   Chloride 100 99  99   CO2 31 (H) 32 (H)  29   BUN 11 14  11   Creatinine 0.7 0.7  0.6 (L)   Anion Gap    12   GFR Non-African American >60 >60  >60   GFR African American >60 >60  >60   Glucose 107 81  108   Calcium 8.8 8.9  9.2   Total Protein 5.8 (L) 5.7 (L)     Albumin 3.6 3.5     Alk Phos 50 48     ALT 18 20     AST 10 11     Bilirubin 0.6 0.6     WBC 19.6 (H) 14.9 (H)  11.0   RBC 3.94 4.15  4.39   Hemoglobin Quant 13.2 13.8  14.6   Hematocrit 38.0 39.9  38.2   MCV 96.4 96.1  87.0   MCH 33.5 33.1  33.3   MCHC 34.8 (H) 34.5  38.2 (H)   MPV 8.3 8.1  9.3   RDW 14.6 14.4  14.1   Platelet Count 273 255  290   Neutrophils % 88 (H) 72  77.2   Immature Granulocytes %    0.5   Lymphocyte % 8 (L) 24  13.7 (L)   Monocytes % 4 4  7.9   Eosinophils % 0 0  0.4   Basophils % 0 0  0.3   Neutrophils # 17.25 (H) 10.73 (H)  8.47 (H)   Immature Granulocytes #    0.06   Lymphocytes # 1.57 3.58  1.50   Monocytes # 0.78 0.60  0.87   Eosinophils # 0.00 (L) 0.00 (L)  0.04 (L)   Basophils # 0.00 0.00  0.03   RBC Morphology  Normal     Poikilocytes 1+      Target Cells 1+      PLATELET SLIDE REVIEW Normal Normal     Source:   Blood Arterial    pH, Blood Gas   7.432    PCO2   44.6    pO2   74.3    HCO3   29.1 (H)    Base Excess   4.1 (H)    O2 Sat   95.2    Pt Temp   37.0    Critical(s) Notified   . No Critical Values    Time Analyzed   1733    Mode   RA              09/10/2013 [facility]) -   LVEF 72%,   Normal RV size and function  Normal LAVI  Normal RAVI  Mild TR  RVSP 41 mm Hg  Mild AS, AVA 1 cm2, peak 42,  mean 26        05/28/2014 [facility] ([facility])-  RIGHT VENTRICLE  The right ventricle is mildly dilated.  Right ventricular systolic function is normal.  Estimated right ventricular systolic pressure is likely underestimated due to a   weak or incomplete tricuspid regurgitation signal and is, at least, 35 mmHg   consistent with normal pulmonary artery pressures. Estimated right atrial pressure   is 5 mmHg.        09/2016 [facility]) -  Concentric remodeling.  ??Normal left ventricular systolic function. LVEF is 65%.  ??Cannot determine diastolic function pattern.  ??Right ventricular hypertrophy with wall thickness of 1 cm.  ??Normal right ventricular systolic function.  ??RVSP is 36 mm Hg. This in combination with right ventricular hypertrophy  ??is evidence for pulmonary hypertension.  ??Normal sized IVC with 100% collapsibility. Estimated RAP is 3 mm Hg.  ??  No aortic leaflet thickening. However all three leaflets are not clearly  ??visualized, and there are elevated gradients across the valve in the  ??absence of obvious leaflet calcification, raising suspicion for possible  ??bicuspid aortic valve. Mean gradient is ~ 15 mm Hg.  ??Redundant mitral valve leaflets, especially the posterior leaflet.  ??Physiologic and/or trace mitral regurgitation is present.  ??      11/20/2013 PFTS ([facility]) -   FORCED VITAL CAPACITY MANEUVER (Spirometry):  With the patient's age of 36   and his height of 6 feet 2 inches, the patient is predicted to have a vital   capacity on his forced expiratory maneuver 5.6 L.  He was only able to   perform 2 L, 36% of predicted.  In 1 second he is predicted to have of the   5.5 L 4 L out in 1 second.  He only had 700 mL before and after   bronchodilator, 16% of predicted.  With the FEV1 to FVC ratio for his height   and age he should have 76% of his air out in 1 second.  He had 35% which is   considered according to the GOLD guide lines as consistent with COPD.  Based   on his FEV1 post bronchodilator of 15% he is  considered to be GOLD 4 which is   very advanced COPD spirometrically.      MECHANICS OF BREATHING:  We could see that the patient with his maximum   inspiratory pressure of -42 cm of water pressure is extremely reduced, and   his MVV is only 25% of predicted.  This suggests that he has compromise to   his chest cage mechanics with regard to his strength and endurance.      STATIC LUNG VOLUMES:  The most important number is his RV/TLC which is   suggesting air trapping and static hyperinflation.  His inspiratory capacity   is reduced to 60%.      GAS TRANSFER:  It is markedly reduced to 16% which suggests that the patient   may require supplemental oxygen with his activities of daily living as his   gas transfer is extremely compromised and may be okay at rest but with   activity one should evaluate for any exercise related hypoxemia.      SPECIFIC AIRWAY CONDUCTANCE:  It is markedly decreased as is his airway   resistance increased.      FLOW VOLUME CURVE:  His flow volume curve is one of obstructive pattern with   significant hyperinflation.      IMPRESSION ON Abriel Whitt:  Dshawn's evaluation here today suggests that at such   a young age the patient has very advanced COPD and this probably is   predisposing Johnatan to his major pulmonary symptom of breathlessness performing   his simple sedentary activities of daily living.  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           06/07/2011 CT TAP ([facility]) -   IMPRESSION  1. Multiple right anterior rib fractures with a small right   pneumothorax and trace right hemothorax, status post chest tube   placement. Subcutaneous chest wall emphysema is present.   2. No acute traumatic injury  involving the abdomen or pelvis.   3. Nasogastric tube in the  midesophagus. Advancement is recommended.   4. Moderately severe emphysema.   4. 1 cm ovoid nodule in the left upper lobe. Repeat CT of the chest   in 3 months without contrast is recommended for further evaluation.           05/27/2014 CT chest w contrast ([facility]) -   Mediastinum:????No significant mediastinal or hilar adenopathy.????Several   subcentimeter nodes likely reactive.????Main pulmonary artery enlargement   up to 3.5 cm raises the possibility of pulmonary hypertension.????Aortic   root and coronary calcifications.????No pericardial effusion.    Upper abdomen:????No acute upper abdominal abnormalities.    Lungs:????Patent central airways with diffuse bronchial thickening and   emphysema in keeping with chronic smoking-related airway disease.????Large   bullous changes in the upper lobes especially the left apex.????Several   scattered small pulmonary nodules including a 6 mm right middle lobe   nodule (233), a couple of 6 mm left lower lobe nodules and other   scattered smaller nodules.????No dominant nodules.????No focal lung   consolidations or effusions.    Bones:????Degenerative changes of the thoracic spine.        01/07/2015 CT WO contrast ([facility]) -   IMPRESSION:    1.????Severe emphysema is noted with an upper lobe predominance.????There is   associated central or peripheral bronchial wall thickening, consistent   with bronchitis.    2.????Few small (2-6 mm) pulmonary nodules are noted, most unchanged since   prior examination.????Consider follow-up imaging in 18-24 months to assess   stability.    3.????Dilated central pulmonary arteries are noted, which may be seen with   pulmonary hypertension.        ECG - normal sinus rhythm, PVCs          6 minute walk test today - 970 feet, desaturations on 3 l/min nasal cannula. Steady pace, no stopping.             Lung Transplantation Listing Considerations (IS[facility]T 2015)   Absolute CI -   ??? Lung transplantation should not be offered to adults with a recent history of malignancy. A 2-year  disease-free interval combined with a low predicted risk of recurrence after lung transplantation may be reasonable, for instance, in non-melanoma localized skin cancer that has been treated appropriately. However, a 5-year disease-free interval is prudent in most cases, particularly for patients with a history of hematologic malignancy, sarcoma, melanoma, or cancers of the breast, bladder, or kidney. Unfortunately, for a portion of patients with a history of cancer, the risk of recurrence may remain too high to proceed with lung transplantation even after a 5-year disease-free interval.     ??? Untreatable significant dysfunction of another major organ system (e.g., heart, liver, kidney, or brain) unless combined organ transplantation can be performed.1      ??? Uncorrected atherosclerotic disease with suspected or confirmed end-organ ischemia or dysfunction and/or coronary artery disease not amenable to revascularization.      ??? Acute medical instability, including, but not limited to, acute sepsis, myocardial infarction, and liver failure.     ??? Uncorrectable bleeding diathesis.    ??? Chronic infection with highly virulent and/or resistant microbes that are poorly controlled pre-transplant.      ??? Evidence of active Mycobacterium tuberculosis infection.    ??? Significant chest wall or spinal deformity expected to cause severe restriction after transplantation.      ??? Class II or III obesity (body mass index [BMI] ?35.0  kg/m2).     ??? Current non-adherence to medical therapy or a history of repeated or prolonged episodes of non-adherence to medical therapy that are perceived to increase the risk of non-adherence after transplantation.     ??? Psychiatric or psychologic conditions associated with the inability to cooperate with the medical/allied health care team and/or adhere with complex medical therapy.     ??? Absence of an adequate or reliable social support system.    ??? Severely limited functional status with poor  rehabilitation potential.      ??? Substance abuse or dependence (e.g., alcohol, tobacco, marijuana, or other illicit substances). abetes mellitus.   ??? 2. History of hypertension.   ??? 3. History of dyslipidemia. History of tobacco abu  Relative CI -  ??? Age >65 years in association with low physiologic reserve and/or other relative contraindications. Although there cannot be endorsement of an upper age limit as an absolute contraindication, adults >79 years old are unlikely to be candidates for lung transplantation in most cases. Although age by itself should not be considered a contraindication to transplant, increasing age generally is associated with comorbid conditions that are either absolute or relative contraindications.    ??? Class I obesity (BMI 30.0-34.9 kg/m2), particularly truncal (central) obesity.    ??? Progressive or severe malnutrition.    ??? Severe, symptomatic osteoporosis.    ??? Extensive prior chest surgery with lung resection.    ??? Mechanical ventilation and/or extracorporeal life support (ECLS). However, carefully selected candidates without other acute or chronic organ dysfunction may be successfully transplanted.    ??? Colonization or infection with highly resistant or highly virulent bacteria, fungi, and certain strains of mycobacteria (e.g., chronic extrapulmonary infection expected to worsen after transplantation).    ??? For patients infected with hepatitis B and/or C, a lung transplant can be considered in patients without significant clinical, radiologic, or biochemical signs of cirrhosis or portal hypertension and who are stable on appropriate therapy. Lung transplantation in candidates with hepatitis B and/or C should be performed in centers with experienced hepatology units.    ??? For patients infected with human immunodeficiency virus (HIV), a lung transplant can be considered in patients with controlled disease with undetectable HIV-RNA, and compliant on combined anti-retroviral therapy. The  most suitable candidates should have no current acquired immunodeficiency syndrome-defining illness. Lung transplantation in HIV-positive candidates should be performed in centers with expertise in the care of HIV-positive patients.    ??? Infection with Burkholderia cenocepacia, Burkholderia gladioli, and multi-drug-resistant Mycobacterium abscessus if the infection is sufficiently treated preoperatively and there is a reasonable expectation for adequate control postoperatively. For patients with these infections to be considered suitable transplant candidates, the patients should be evaluated by centers with significant experience managing these infections in the transplant setting, and patients should be aware of the increased risk of transplant because of these infections.    ??? Atherosclerotic disease burden sufficient to put the candidate at risk for end-organ disease after lung transplantation. With regard to coronary artery disease, some patients will be candidates for percutaneous coronary intervention or coronary artery bypass graft (CABG) preoperatively or, in some instances, combined lung transplant and CABG. The preoperative evaluation, type of coronary stent used (bare metal vs drug eluting), and degree of coronary artery disease deemed acceptable vary among transplant centers.    ??? Other medical conditions that have not resulted in end-stage organ damage, such as diabetes mellitus, systemic hypertension, epilepsy, central venous obstruction, peptic ulcer disease, or gastroesophageal reflux, should be  optimally treated before transplantation.      Timing of referral for COPD -   ??? Disease is progressive, despite maximal treatment including medication, pulmonary rehabilitation, and oxygen therapy.  ??? Patient is not a candidate for endoscopic or surgical LVRS. Simultaneous referral of patients with COPD for both lung transplant and LVRS evaluation is appropriate.  ??? BODE index of 5 to 6.  ??? Paco2 >50 mm Hg  or 6.6 kPa and/or Pao2 <60 mm Hg or 8 kPa.  ??? FEV1 <25% predicted.   ??? Timing of listing (presence of one criterion is sufficient):   BODE index ?7.   FEV1 <15% to 20% predicted.   Three or more severe exacerbations during the preceding year.   One severe exacerbation with acute hypercapnic respiratory failure.   Moderate to severe pulmonary hypertension.        Discussion:         Assessment:   1. Severe COPD  2. RV hypertrophy on TTE 09/2016  3. No signs or symptoms of right heart failure  4. Never had invasive hemodynamic assessment  5. No signs of RV strain on ECG other than low R precordial lead voltage seen in pulmonary disease  6. Chronic hypoxemia  7. Improvement in 6 MWT distance compared with that done March 2016 at the [facility]. Now above 700 feet.  8. Resistance to pursue lung transplant evaluation because of not only cost but because deterred by the ~ 50% probability of survival in 5 years cited to him in the past (?at transplant center)  9. Divorced, one child, no grandchildren           Plan:   1. Repeat echocardiogram    2. Pulmonary function tests with Dr. Gae Gallop    3. Right heart catheterization. Needs PFTs, and ABG if not done by this time    4. Return visit in 4-6 weeks        Lilia Argue, M.D.  Advanced Heart Failure and Pulmonary Hypertension  [facility]

## 2017-06-28 NOTE — Patient Instructions (Addendum)
1. Repeat echocardiogram      2. Pulmonary function tests with Dr. Cheryll Cockayne      3. Right heart catheterization      4. Lab work      5. Return visit in 4-6 weeks

## 2017-07-06 NOTE — Telephone Encounter (Signed)
Patient called and cancelled his 2-D echo, RH cath and follow up appt. [place] is moving [place] to take [place] of his ailing mother.  He stated that when he she is better he will call to reschedule.  He does want to follow with Dr. Lidia CollumKassis-George for his cardiac problems.

## 2017-07-07 ENCOUNTER — Inpatient Hospital Stay: Payer: PRIVATE HEALTH INSURANCE

## 2017-07-14 ENCOUNTER — Ambulatory Visit: Payer: MEDICARE

## 2017-07-27 ENCOUNTER — Encounter

## 2017-08-05 ENCOUNTER — Encounter: Attending: Cardiovascular Disease

## 2017-09-14 ENCOUNTER — Encounter

## 2017-09-14 MED ORDER — SPIRIVA RESPIMAT 2.5 MCG/ACT IN AERS
2.5 MCG/ACT | RESPIRATORY_TRACT | 5 refills | Status: DC
Start: 2017-09-14 — End: 2020-02-26

## 2017-12-01 NOTE — Progress Notes (Signed)
11/10/17 KG Lm for pt to call to sched ov with MA.  Sent letter for no response

## 2020-02-26 ENCOUNTER — Ambulatory Visit: Admit: 2020-02-26 | Discharge: 2020-02-26 | Payer: MEDICARE | Attending: Critical Care Medicine

## 2020-02-26 DIAGNOSIS — Z942 Lung transplant status: Secondary | ICD-10-CM

## 2020-02-26 LAB — FULL PFT STUDY WITH BRONCHODILATOR
DLCO %Pred: 56 %
DLCO Pred: 30.54 ml/min/mmHg
DLCO/VA %Pred: 89 %
DLCO/VA Pred: 3.91 ml/min/mmHg
DLCO/VA: 3.51 ml/min/mmHg
DLCO: 17.21 ml/min/mmHg
Expiratory Time: 8.88 s
FEF 25-75% %Pred-Post: 59 %
FEF 25-75% %Pred-Pre: 48 L/sec
FEF 25-75% Pred: 3.09 L/sec
FEF 25-75%-Post: 1.82 L/sec
FEF 25-75%-Pre: 1.51 L/sec
FEV1 %Pred-Post: 68 %
FEV1 %Pred-Pre: 65 %
FEV1 Pred: 3.93 L
FEV1-Post: 2.69 L
FEV1-Pre: 2.56 L
FEV1/FVC %Pred-Post: 98 %
FEV1/FVC Pred: 76 %
FEV1/FVC-Post: 75 %
FEV1/FVC-Pre: 69 %
FVC %Pred-Post: 69 L
FVC %Pred-Pre: 72 %
FVC Pred: 5.17 L
FVC-Pre: 3.74 L
GAW PRED: 1.03 L/s/cmH2O
Gaw %Pred: 43 %
Gaw: 0.44 L/s/cmH2O
MEP: 89
MIP: 98
MVV %Pred-Pre: 77 %
MVV Pred: 150 L/min
MVV-Pre: 116 L/min
PVC-Post: 3.6 L
RV %Pred: 72 %
RV Pred: 2.52 L
RV: 1.83 L
Raw %Pred: 158 %
Raw Pred: 1.45 cmH2O/L/s
Raw: 2.3 cmH2O/L/s
SVC %Pred: 79 %
SVC Pred: 5.17 L
SVC: 4.12 L
TLC %Pred: 76 %
TLC Pred: 7.81 L
TLC: 5.95 L
VA %Pred: 67 %
VA Pred: 7.81 L
VA: 5.28 L

## 2020-04-15 NOTE — Telephone Encounter (Signed)
Bruce Aguirre called.  He is presently in [place].  Was a Bruce Aguirre patient.  Supposed to have cath in [place] but he may come home and have everything here.  Needs a follow up.  He has not seen Bruce Aguirre since 2018.  He will have [place] fax [place] his records and call us back to schedule appt.

## 2020-07-01 ENCOUNTER — Emergency Department: Admit: 2020-07-01 | Payer: MEDICARE

## 2020-07-01 ENCOUNTER — Inpatient Hospital Stay
Admit: 2020-07-01 | Discharge: 2020-07-01 | Disposition: A | Discharge status: Home / Self Care | Payer: MEDICARE | Attending: Emergency Medicine

## 2020-07-01 DIAGNOSIS — K1120 Sialoadenitis, unspecified: Secondary | ICD-10-CM

## 2020-07-01 LAB — COMPREHENSIVE METABOLIC PANEL
ALT: 12 U/L (ref 0–40)
AST: 13 U/L (ref 0–39)
Albumin: 4.1 g/dL (ref 3.5–5.2)
Alkaline Phosphatase: 65 U/L (ref 40–129)
Anion Gap: 8 mmol/L (ref 7–16)
BUN: 26 mg/dL — ABNORMAL HIGH (ref 6–23)
CO2: 24 mmol/L (ref 22–29)
Calcium: 9.2 mg/dL (ref 8.6–10.2)
Chloride: 108 mmol/L — ABNORMAL HIGH (ref 98–107)
Creatinine: 1.5 mg/dL — ABNORMAL HIGH (ref 0.7–1.2)
GFR African American: 57
GFR Non-African American: 47 mL/min/{1.73_m2} (ref >=60)
Glucose: 103 mg/dL — ABNORMAL HIGH (ref 74–99)
Potassium: 5.6 mmol/L — ABNORMAL HIGH (ref 3.5–5.0)
Sodium: 140 mmol/L (ref 132–146)
Total Bilirubin: 0.6 mg/dL (ref 0.0–1.2)
Total Protein: 6.6 g/dL (ref 6.4–8.3)

## 2020-07-01 LAB — CBC WITH AUTO DIFFERENTIAL
Basophils %: 0.4 % (ref 0.0–2.0)
Basophils Absolute: 0.04 E9/L (ref 0.00–0.20)
Eosinophils %: 0.4 % (ref 0.0–6.0)
Eosinophils Absolute: 0.04 E9/L — ABNORMAL LOW (ref 0.05–0.50)
Hematocrit: 32.7 % — ABNORMAL LOW (ref 37.0–54.0)
Hemoglobin: 12 g/dL — ABNORMAL LOW (ref 12.5–16.5)
Immature Granulocytes #: 0.1 E9/L
Immature Granulocytes %: 1 % (ref 0.0–5.0)
Lymphocytes %: 7.2 % — ABNORMAL LOW (ref 20.0–42.0)
Lymphocytes Absolute: 0.75 E9/L — ABNORMAL LOW (ref 1.50–4.00)
MCH: 33.5 pg (ref 26.0–35.0)
MCHC: 36.7 % — ABNORMAL HIGH (ref 32.0–34.5)
MCV: 91.3 fL (ref 80.0–99.9)
MPV: 10.8 fL (ref 7.0–12.0)
Monocytes %: 7 % (ref 2.0–12.0)
Monocytes Absolute: 0.73 E9/L (ref 0.10–0.95)
Neutrophils %: 84 % — ABNORMAL HIGH (ref 43.0–80.0)
Neutrophils Absolute: 8.79 E9/L — ABNORMAL HIGH (ref 1.80–7.30)
Platelets: 238 E9/L (ref 130–450)
RBC: 3.58 E12/L — ABNORMAL LOW (ref 3.80–5.80)
RDW: 14.5 fL (ref 11.5–15.0)
WBC: 10.5 E9/L (ref 4.5–11.5)

## 2020-07-01 LAB — URINALYSIS
Bilirubin Urine: NEGATIVE
Blood, Urine: NEGATIVE
Glucose, Ur: NEGATIVE mg/dL
Leukocyte Esterase, Urine: NEGATIVE
Nitrite, Urine: NEGATIVE
Protein, UA: NEGATIVE mg/dL
Specific Gravity, UA: >=1.03 (ref 1.005–1.030)
Urobilinogen, Urine: 0.2 E.U./dL (ref <2.0)
pH, UA: 5 (ref 5.0–9.0)

## 2020-07-01 LAB — AMYLASE: Amylase: 81 U/L (ref 20–100)

## 2020-07-01 LAB — MAGNESIUM: Magnesium: 1.6 mg/dL (ref 1.6–2.6)

## 2020-07-01 LAB — LIPASE: Lipase: 20 U/L (ref 13–60)

## 2020-07-01 MED ORDER — SODIUM CHLORIDE 0.9 % IV BOLUS
0.9 % | Freq: Once | INTRAVENOUS | Status: DC
Start: 2020-07-01 — End: 2020-07-01

## 2020-07-01 MED ORDER — AMOXICILLIN-POT CLAVULANATE 875-125 MG PO TABS
875-125 MG | Freq: Once | ORAL | Status: AC
Start: 2020-07-01 — End: 2020-07-01
  Administered 2020-07-01: 23:00:00 1 via ORAL

## 2020-07-01 MED ORDER — AMOXICILLIN-POT CLAVULANATE 875-125 MG PO TABS
875-125 MG | ORAL_TABLET | Freq: Two times a day (BID) | ORAL | 0 refills | Status: AC
Start: 2020-07-01 — End: 2020-07-08

## 2020-07-01 MED ORDER — IOPAMIDOL 76 % IV SOLN
76 % | Freq: Once | INTRAVENOUS | Status: AC | PRN
Start: 2020-07-01 — End: 2020-07-01
  Administered 2020-07-01: 20:00:00 75 mL via INTRAVENOUS

## 2020-07-01 MED FILL — AMOXICILLIN-POT CLAVULANATE 875-125 MG PO TABS: 875-125 mg | ORAL | Qty: 1

## 2020-07-01 NOTE — ED Notes (Signed)
To ct scan via bed     Azzie Roup, RN  07/01/20 1607

## 2020-07-01 NOTE — ED Notes (Signed)
States he woke up with it swollen.  Had this happen 3 years ago and it had to due with his platelets     Azzie Roup, RN  07/01/20 1444

## 2020-07-01 NOTE — Discharge Instructions (Signed)
Return if symptoms change or worsen.     Thank you for the opportunity to care for you during this emergency department visit. I hope you are doing better. We strive to always improve our care. You may receive a survey and I hope you had a positive experience here. We greatly appreciate your 5 scores.

## 2020-07-01 NOTE — ED Notes (Signed)
Pt is refusing to have the sodium chloride infusion.  States it makes his legs swollen and he does not want it     Azzie Roup, RN  07/01/20 1659

## 2020-07-01 NOTE — ED Provider Notes (Signed)
Chief complaint:  Neck swelling    HPI history provided by the patient  Patient comes in complaining of swelling to the left side of his neck and under his chin starting this morning.  Mildly tender.  States that he has a history of this once in the past and it was something behind his throat he says.  No fevers, sweats or chills.  Has a history of lung transplant and is on antirejection medications as well as steroids.  No runny nose, nasal congestion or sore throat.  No difficulty breathing or swallowing.  No shortness of breath or cough.  No chest pain or palpitations.  No general rashes.  No leg pain or swelling.  No abdominal pain.  No confusion or headache.  No other facial or head area swelling.  No treatment prior to arrival.  Nothing has made it better or worse.    Review of Systems   Constitutional: Negative for chills, diaphoresis, fatigue and fever.   HENT: Positive for facial swelling. Negative for congestion, ear discharge, sinus pressure, sore throat, trouble swallowing and voice change.    Respiratory: Negative for cough, chest tightness, shortness of breath and wheezing.    Cardiovascular: Negative for chest pain, palpitations and leg swelling.   Gastrointestinal: Negative for abdominal pain, diarrhea, nausea and vomiting.   Genitourinary: Negative for dysuria, flank pain, frequency and urgency.   Musculoskeletal: Negative for arthralgias, back pain, gait problem, joint swelling, myalgias, neck pain and neck stiffness.   Skin: Negative for rash and wound.   Neurological: Negative for dizziness, seizures, syncope, weakness, light-headedness, numbness and headaches.   Hematological: Negative for adenopathy.   All other systems reviewed and are negative.       Physical Exam  Vitals and nursing note reviewed.   Constitutional:       General: He is not in acute distress.     Appearance: He is well-developed. He is not ill-appearing, toxic-appearing or diaphoretic.   HENT:      Head: Normocephalic and  atraumatic.      Nose: Nose normal.      Mouth/Throat:      Pharynx: Oropharynx is clear. Uvula midline. No pharyngeal swelling, oropharyngeal exudate, posterior oropharyngeal erythema or uvula swelling.      Tonsils: No tonsillar exudate or tonsillar abscesses.      Comments: No pharyngeal area erythema, swelling or tonsillar perjury.  Uvula midline.  No trismus or stridor.  Airway patent.  No intraoral erythema, swelling, pointing or signs of abscess.  Eyes:      General: No scleral icterus.     Extraocular Movements: Extraocular movements intact.      Conjunctiva/sclera: Conjunctivae normal.      Pupils: Pupils are equal, round, and reactive to light.   Neck:      Trachea: Trachea and phonation normal.      Comments: General swelling to the left anterior lateral neck up into the left submandibular jaw angle region.  Mildly tender.  No palpable fluctuance or induration.  No erythema or increased warmth.  Trachea midline.  No meningeal signs.  Cardiovascular:      Rate and Rhythm: Normal rate and regular rhythm.      Heart sounds: Normal heart sounds. No murmur heard.     Pulmonary:      Effort: Pulmonary effort is normal. No respiratory distress.      Breath sounds: Normal breath sounds. No stridor, decreased air movement or transmitted upper airway sounds. No decreased breath sounds,  wheezing, rhonchi or rales.   Chest:      Chest wall: No tenderness.   Abdominal:      General: Bowel sounds are normal. There is no distension.      Palpations: Abdomen is soft.      Tenderness: There is no abdominal tenderness. There is no right CVA tenderness, left CVA tenderness, guarding or rebound.   Musculoskeletal:         General: No swelling, tenderness, deformity or signs of injury.      Cervical back: Normal range of motion and neck supple.      Right lower leg: No edema.      Left lower leg: No edema.      Comments: No pretibial edema or calf pain.   Skin:     General: Skin is warm and dry.      Coloration: Skin is not  cyanotic, jaundiced, mottled or pale.      Findings: No erythema or rash.   Neurological:      General: No focal deficit present.      Mental Status: He is alert and oriented to person, place, and time.      GCS: GCS eye subscore is 4. GCS verbal subscore is 5. GCS motor subscore is 6.      Cranial Nerves: No cranial nerve deficit.      Coordination: Coordination normal.          Procedures     MDM        5:49 PM EDT  Patient sitting the bed resting comfortably no distress.  Exam is unchanged.  Swelling not worsened.  No associated erythema, increased warmth or particular tenderness to the left submandibular region.  Airway patent.  No trismus or stridor.  Patient updated on work-up results.  Very comfortable in home with outpatient follow-up at this time.  Does state that [place] and is asking in general for a cardiology follow-up for him if we have any names.  Not complaining of chest pain, palpitations or shortness of breath or other symptoms at this time.         --------------------------------------------- PAST HISTORY ---------------------------------------------  Past Medical History:  has a past medical history of Asthma, COPD (chronic obstructive pulmonary disease) (HCC), and Former smoker.    Past Surgical History:  has a past surgical history that includes Appendectomy and Tonsillectomy.    Social History:  reports that he quit smoking about 11 years ago. His smoking use included cigarettes. He started smoking about 31 years ago. He has a 20.00 pack-year smoking history. He has never used smokeless tobacco. He reports current alcohol use of about 1.0 standard drinks of alcohol per week. He reports that he does not use drugs.    Family History: family history includes COPD in his mother; Heart Disease in his father.     The patient???s home medications have been reviewed.    Allergies: Nutritional supplements    -------------------------------------------------- RESULTS  -------------------------------------------------  Labs:  Results for orders placed or performed during the hospital encounter of 07/01/20   CBC Auto Differential   Result Value Ref Range    WBC 10.5 4.5 - 11.5 E9/L    RBC 3.58 (L) 3.80 - 5.80 E12/L    Hemoglobin 12.0 (L) 12.5 - 16.5 g/dL    Hematocrit 30.832.7 (L) 37.0 - 54.0 %    MCV 91.3 80.0 - 99.9 fL    MCH 33.5 26.0 - 35.0 pg  MCHC 36.7 (H) 32.0 - 34.5 %    RDW 14.5 11.5 - 15.0 fL    Platelets 238 130 - 450 E9/L    MPV 10.8 7.0 - 12.0 fL    Neutrophils % 84.0 (H) 43.0 - 80.0 %    Immature Granulocytes % 1.0 0.0 - 5.0 %    Lymphocytes % 7.2 (L) 20.0 - 42.0 %    Monocytes % 7.0 2.0 - 12.0 %    Eosinophils % 0.4 0.0 - 6.0 %    Basophils % 0.4 0.0 - 2.0 %    Neutrophils Absolute 8.79 (H) 1.80 - 7.30 E9/L    Immature Granulocytes # 0.10 E9/L    Lymphocytes Absolute 0.75 (L) 1.50 - 4.00 E9/L    Monocytes Absolute 0.73 0.10 - 0.95 E9/L    Eosinophils Absolute 0.04 (L) 0.05 - 0.50 E9/L    Basophils Absolute 0.04 0.00 - 0.20 E9/L   Comprehensive Metabolic Panel   Result Value Ref Range    Sodium 140 132 - 146 mmol/L    Potassium 5.6 (H) 3.5 - 5.0 mmol/L    Chloride 108 (H) 98 - 107 mmol/L    CO2 24 22 - 29 mmol/L    Anion Gap 8 7 - 16 mmol/L    Glucose 103 (H) 74 - 99 mg/dL    BUN 26 (H) 6 - 23 mg/dL    CREATININE 1.5 (H) 0.7 - 1.2 mg/dL    GFR Non-African American 47 >=60 mL/min/1.73    GFR African American 57     Calcium 9.2 8.6 - 10.2 mg/dL    Total Protein 6.6 6.4 - 8.3 g/dL    Albumin 4.1 3.5 - 5.2 g/dL    Total Bilirubin 0.6 0.0 - 1.2 mg/dL    Alkaline Phosphatase 65 40 - 129 U/L    ALT 12 0 - 40 U/L    AST 13 0 - 39 U/L   Magnesium   Result Value Ref Range    Magnesium 1.6 1.6 - 2.6 mg/dL   Urinalysis   Result Value Ref Range    Color, UA Yellow Straw/Yellow    Clarity, UA Clear Clear    Glucose, Ur Negative Negative mg/dL    Bilirubin Urine Negative Negative    Ketones, Urine TRACE (A) Negative mg/dL    Specific Gravity, UA >=1.030 1.005 - 1.030    Blood, Urine  Negative Negative    pH, UA 5.0 5.0 - 9.0    Protein, UA Negative Negative mg/dL    Urobilinogen, Urine 0.2 <2.0 E.U./dL    Nitrite, Urine Negative Negative    Leukocyte Esterase, Urine Negative Negative   Amylase   Result Value Ref Range    Amylase 81 20 - 100 U/L   Lipase   Result Value Ref Range    Lipase 20 13 - 60 U/L       Radiology:  CT SOFT TISSUE NECK W CONTRAST   Final Result   1. Acute inflammatory changes in the left submandibular gland, which is   mildly enlarged, edematous and hyperenhancing.   2. No sialolith or ductal dilatation identified.  No abscess.             ------------------------- NURSING NOTES AND VITALS REVIEWED ---------------------------  Date / Time Roomed:  07/01/2020  1:18 PM  ED Bed Assignment:  23/23    The nursing notes within the ED encounter and vital signs as below have been reviewed.   BP 136/69    Pulse  56    Temp 97.7 ??F (36.5 ??C) (Temporal)    Resp 18    SpO2 98%   Oxygen Saturation Interpretation: Normal      ------------------------------------------ PROGRESS NOTES ------------------------------------------  I have spoken with the patient and discussed today???s results, in addition to providing specific details for the plan of care and counseling regarding the diagnosis and prognosis.  Their questions are answered at this time and they are agreeable with the plan. I discussed at length with them reasons for immediate return here for re evaluation. They will followup with primary care by calling their office tomorrow.      --------------------------------- ADDITIONAL PROVIDER NOTES ---------------------------------  At this time the patient is without objective evidence of an acute process requiring hospitalization or inpatient management.  They have remained hemodynamically stable throughout their entire ED visit and are stable for discharge with outpatient follow-up.     The plan has been discussed in detail and they are aware of the specific conditions for emergent  return, as well as the importance of follow-up.      New Prescriptions    AMOXICILLIN-CLAVULANATE (AUGMENTIN) 875-125 MG PER TABLET    Take 1 tablet by mouth 2 times daily for 7 days       Diagnosis:  1. Sialadenitis        Disposition:  Patient's disposition: Discharge to home  Patient's condition is stable.         Ronda Fairly Sunday Klos, DO  07/01/20 1750

## 2020-07-01 NOTE — ED Notes (Signed)
Bed: 23  Expected date:   Expected time:   Means of arrival:   Comments:     Clemmie Krill, RN  07/01/20 1318

## 2020-07-01 NOTE — ED Notes (Signed)
pts left side of his neck is swollen.       Azzie Roup, RN  07/01/20 1443

## 2020-08-07 ENCOUNTER — Ambulatory Visit: Admit: 2020-08-07 | Discharge: 2020-08-07 | Payer: MEDICARE | Attending: Cardiovascular Disease

## 2020-08-07 DIAGNOSIS — I10 Essential (primary) hypertension: Secondary | ICD-10-CM

## 2020-08-07 MED ORDER — METOPROLOL TARTRATE 25 MG PO TABS
25 MG | ORAL_TABLET | Freq: Two times a day (BID) | ORAL | 1 refills | Status: AC
Start: 2020-08-07 — End: ?

## 2020-08-20 ENCOUNTER — Inpatient Hospital Stay: Payer: MEDICARE

## 2020-10-22 NOTE — Telephone Encounter (Signed)
Spoke with patient regarding echocardiogram, patient stated he is having it done in [place] and will have results sent here.

## 2021-03-06 NOTE — Progress Notes (Signed)
Spoke to pt about scheduling 1 yr ov [place].  (will need new provider in future)  Pt is currently in [place] caring for his mother.  He will call when he is back and ready.

## 2022-01-02 IMAGING — MR MRI LUMBAR SPINE WITHOUT CONTRAST
4 of 6 series · 19 of 48 positions shown · IV contrast (gadolinium)
Comparison: None.

________________________________________________________________________________________________ 
MRI THORACIC SPINE WITHOUT CONTRAST, MRI LUMBAR SPINE WITHOUT CONTRAST, 01/02/2022 
[DATE]: 
CLINICAL INDICATION: Mid and low back pain. History of osteoporosis with 
concurrent pathological fracture. Lung transplantation.
TECHNIQUE: Multiplanar, multiecho position MR images of the thoracic and lumbar 
spine were performed without intravenous gadolinium enhancement. Patient was 
scanned on a 1.5T  magnet.

[Series 101: survey · axial · 10.0mm · 1.39mm/px · z∈[-33,+201]mm · 6 of 10 slices shown]
[im 1/10]
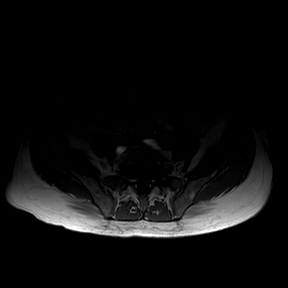
[im 2/10]
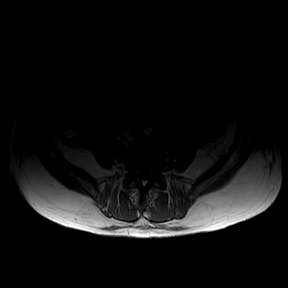
[im 4/10]
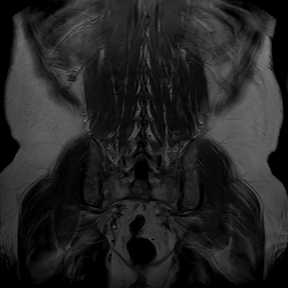
[im 6/10]
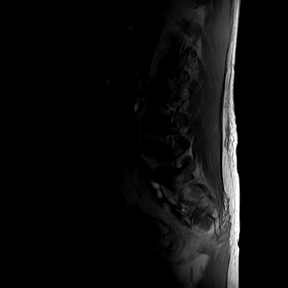
[im 8/10]
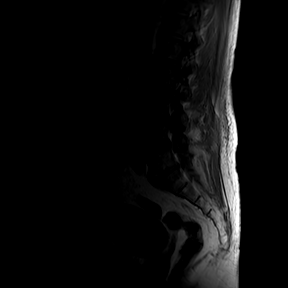
[im 10/10]
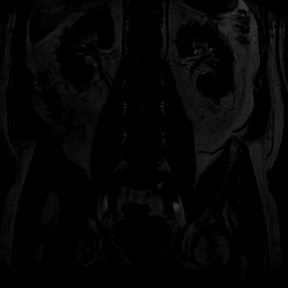

[Series 201: t2w_cor-surv · coronal · 6.0mm · 0.60mm/px · 3 of 5 slices shown]
[im 1/5]
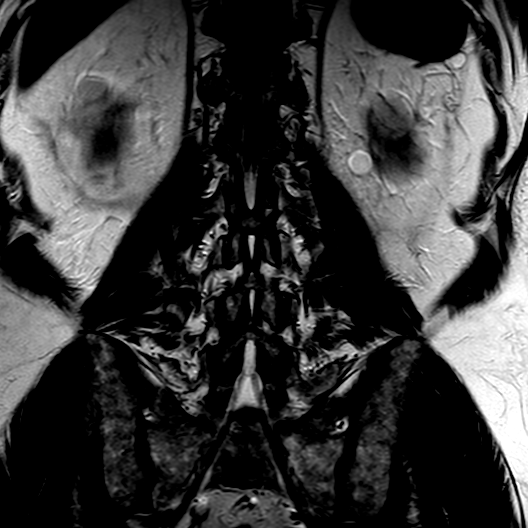
[im 3/5]
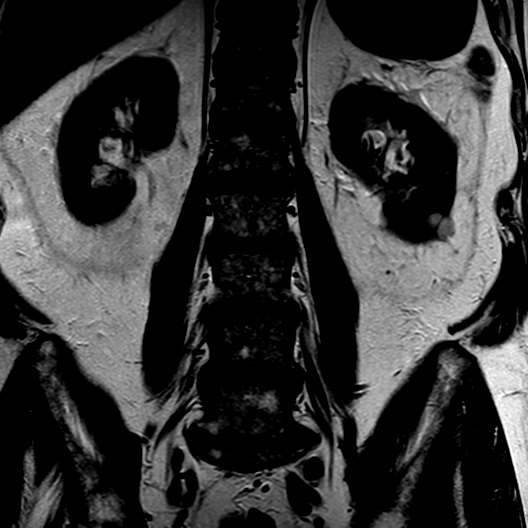
[im 5/5]
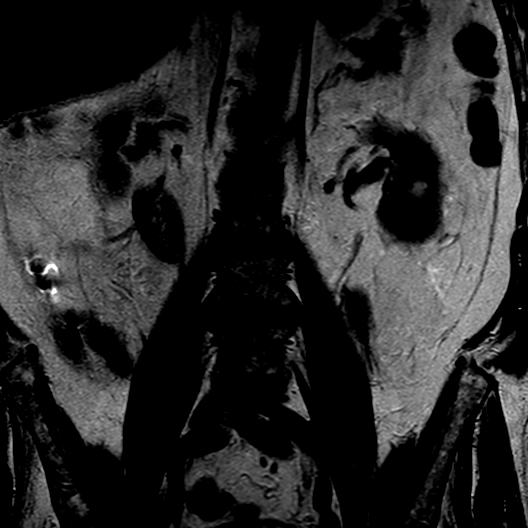

[Series 301: t1_tse_sag · sagittal · 4.0mm · 0.48mm/px · 7 of 17 slices shown]
[im 1/17]
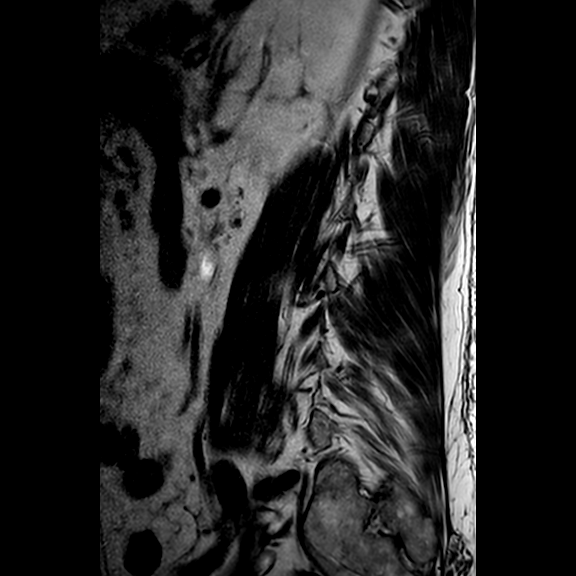
[im 3/17]
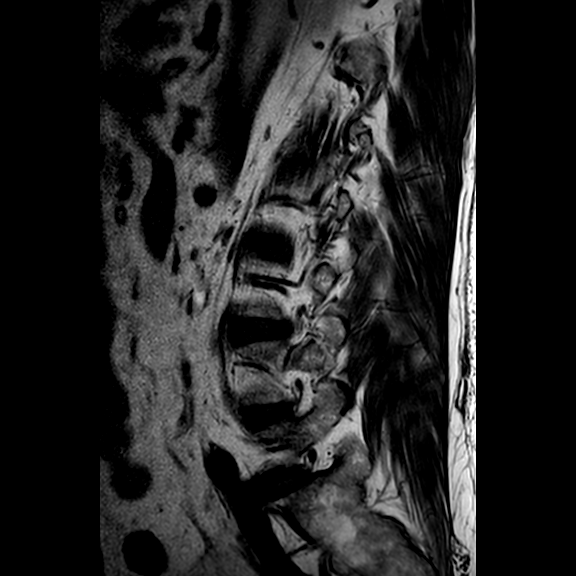
[im 5/17]
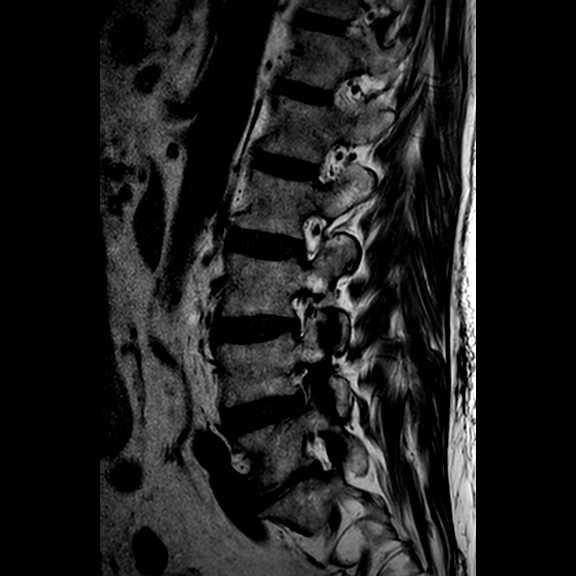
[im 7/17]
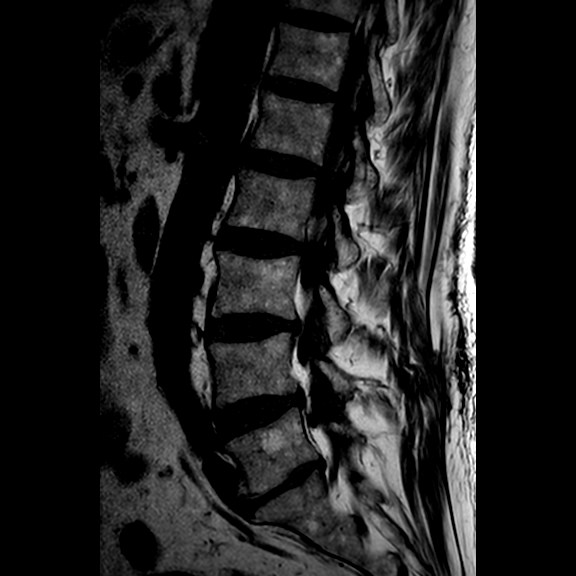
[im 10/17]
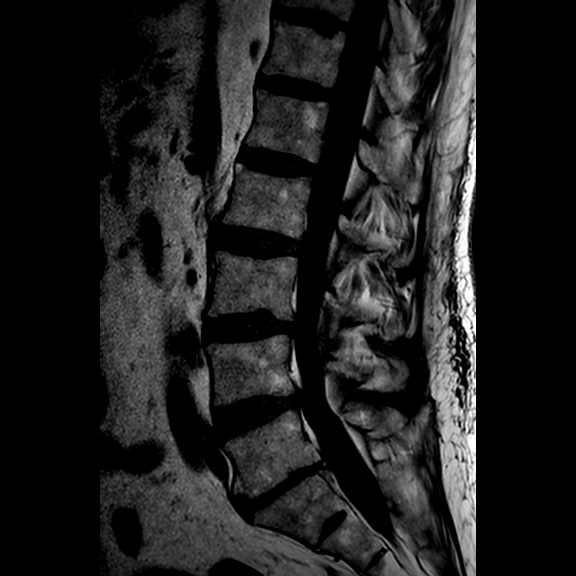
[im 12/17]
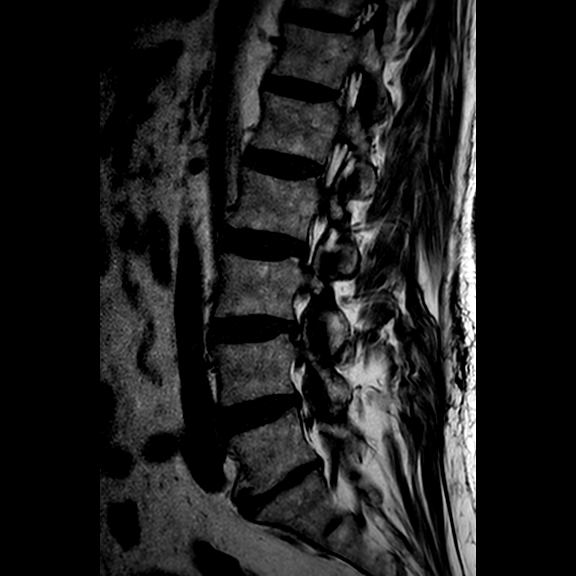
[im 14/17]
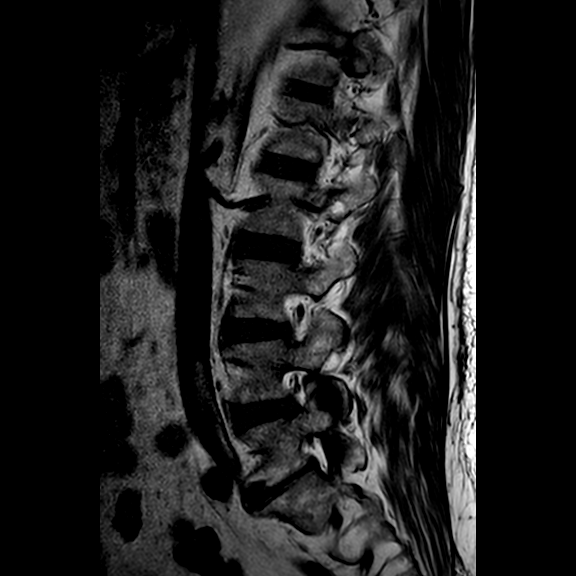

[Series 401: t2_tse_sag · sagittal · 4.0mm · 0.43mm/px · 3 of 17 slices shown]
[im 3/17]
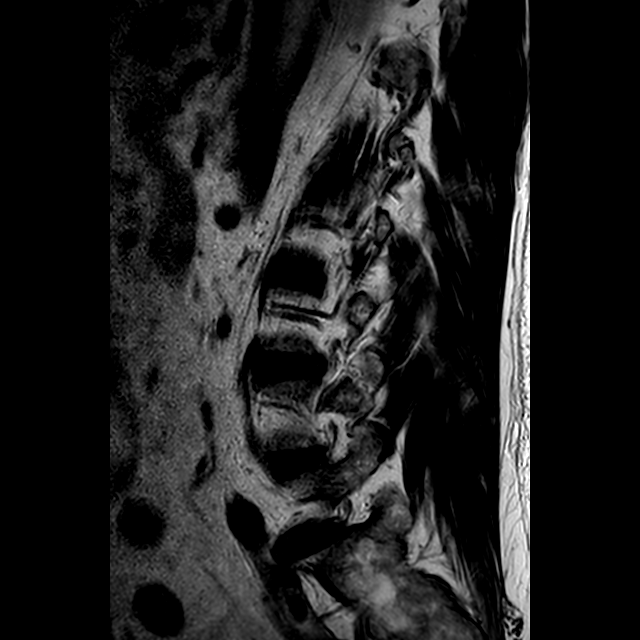
[im 10/17]
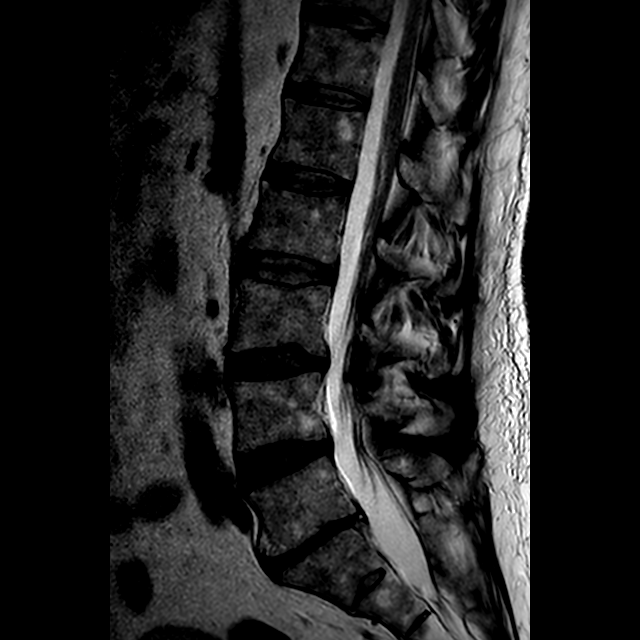
[im 14/17]
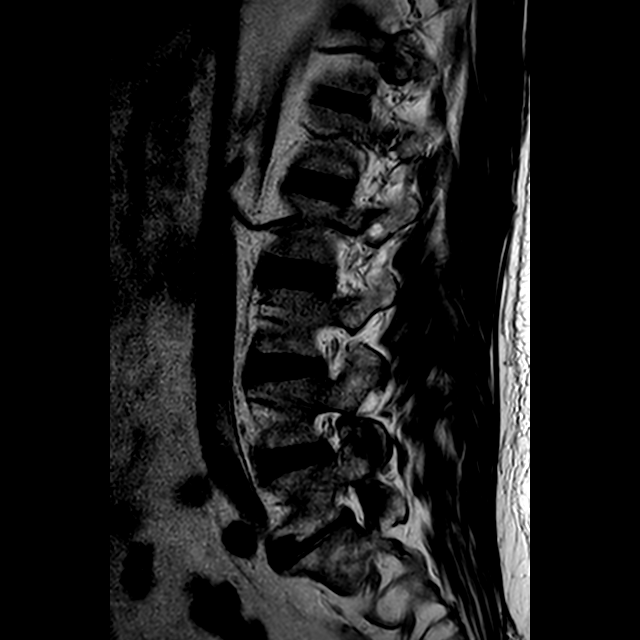

[19 of 48 positions shown; findings below may reference images not displayed]

FINDINGS: VERTEBRA: The vertebral bodies are normal in height and alignment. No acute 
vertebral body fracture. Multifocal degenerative change, most marked at L5-S1 5 
mm retrolisthesis at L5-S1. No marrow replacing lesions.  
SPINAL CORD: Flattening of the ventral cord at the level of the T2-3 and T7-T8 
disc protrusions. Spinal cord is otherwise normal in caliber and signal 
intensity.  Conus medullaris is at the level of L1.  
THORACIC DISCS: Multilevel degenerative disc disease. T2-3 left paracentral disc 
protrusion flattens the left ventral cord. T7-8 small central disc protrusion 
flattens the ventral cord. T10-T11 tiny right paracentral disc protrusion. 
Multifocal thoracic disc bulges. No thoracic central canal stenosis. Mild 
narrowing of several upper thoracic neural foramina. 
Modic I-II: None. 
Ligamentum Flavum > 2.5 mm: All lumbar levels 
T12-L1: The disc is normal in height and signal. No disc herniation. Normal 
facets. No spinal canal or neural foraminal stenosis. 
L1-L2: The disc is normal in height and signal. No disc herniation. Normal 
facets. No spinal canal or neural foraminal stenosis. 
L2-L3: The disc is normal in height and signal. No disc herniation. Mild facet 
arthropathy. No spinal canal or neural foraminal stenosis. 
L3-L4: The disc is normal in height with disc desiccation. No disc herniation. 
Mild facet arthropathy and mild prominence of the dorsal epidural fat. Mild 
central canal/lateral recess stenosis. No neural foraminal stenosis. 
L4-L5: Mild disc bulge. The disc is normal in height with disc desiccation. No 
disc herniation. Mild facet arthropathy. Mild prominence of the dorsal epidural. 
No central canal stenosis. Mild lateral recess/left neural foraminal stenosis. 
L5-S1: Osteophyte-disc complex, with central posterior predominance. Mild facet 
arthropathy. No spinal canal or neural foraminal stenosis. 
OTHER: Localizer view demonstrates degenerative change of the cervical spine. 
The aorta is normal in diameter. The posterior paraspinal soft tissues are 
negative. Renal cysts.
IMPRESSION: 1.  Multifocal degenerative change. 
2.  Flattening of the ventral cord at the level of the T2-3 and T7-T8 disc 
protrusions. T10-T11 tiny right paracentral disc protrusion and mild narrowing 
of several upper thoracic neural foramina. 
3.  L3-L4 mild central canal/lateral recess stenosis.  
4.  L4-L5 mild lateral recess/left neural foraminal stenosis. 
5.  L5-S1 osteophyte-disc complex, with central posterior predominance.

## 2022-01-02 IMAGING — MR MRI THORACIC SPINE WITHOUT CONTRAST
6 of 10 series · 17 of 48 positions shown · IV contrast (gadolinium)
Comparison: None.

________________________________________________________________________________________________ 
MRI THORACIC SPINE WITHOUT CONTRAST, MRI LUMBAR SPINE WITHOUT CONTRAST, 01/02/2022 
[DATE]: 
CLINICAL INDICATION: Mid and low back pain. History of osteoporosis with 
concurrent pathological fracture. Lung transplantation.
TECHNIQUE: Multiplanar, multiecho position MR images of the thoracic and lumbar 
spine were performed without intravenous gadolinium enhancement. Patient was 
scanned on a 1.5T  magnet.

[Series 101: survey · axial · 10.0mm · 1.67mm/px · z∈[-30,+199]mm · 3 of 15 slices shown]
[im 1/15]
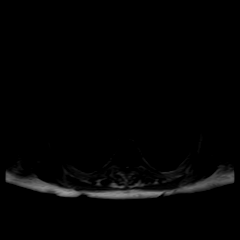
[im 8/15]
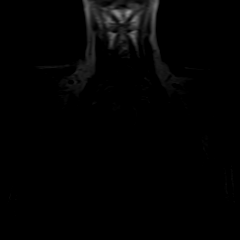
[im 15/15]
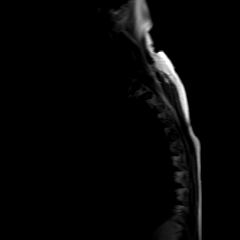

[Series 201: t2w_cor-surv · coronal · 10.0mm · 0.88mm/px · 4 of 20 slices shown]
[im 1/20]
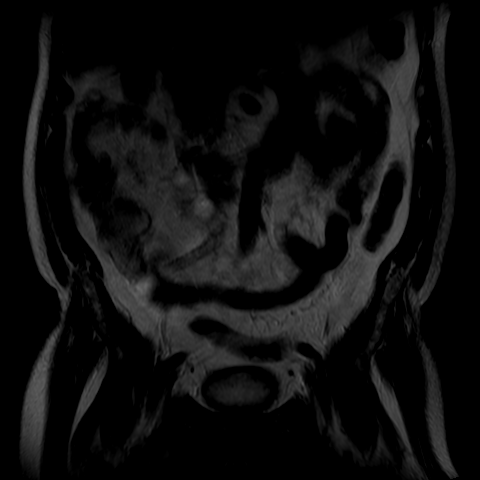
[im 7/20]
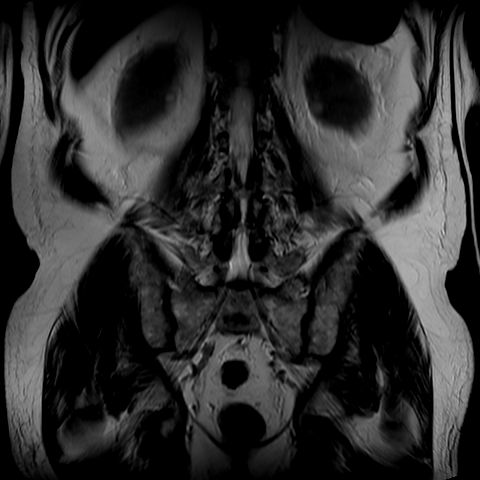
[im 13/20]
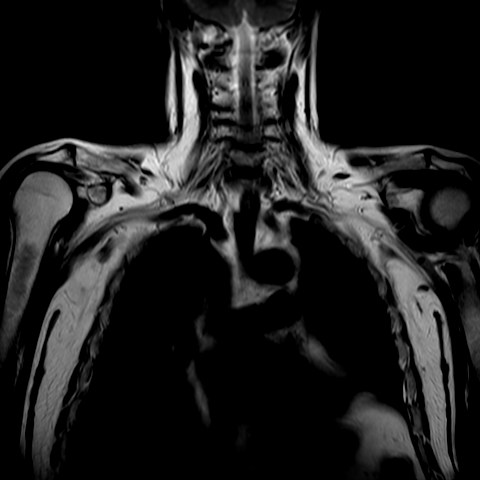
[im 20/20]
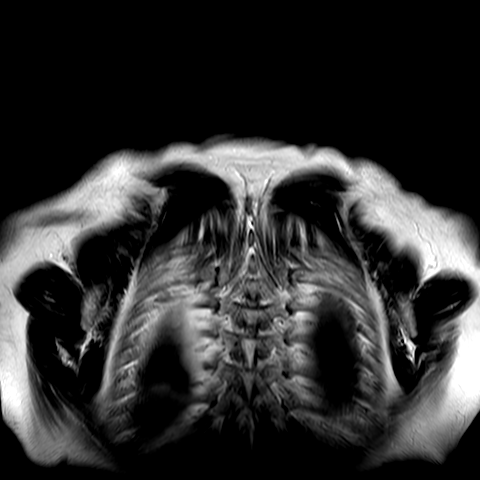

[Series 301: T1 · sagittal · 5.5mm · 0.66mm/px · 3 of 15 slices shown (1 of 3)]
[im 1/15]
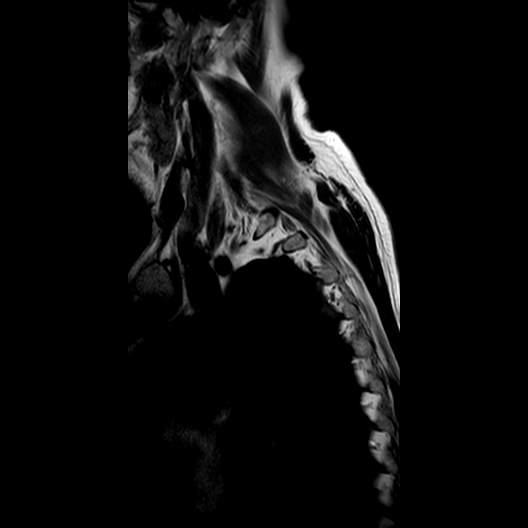
[im 8/15]
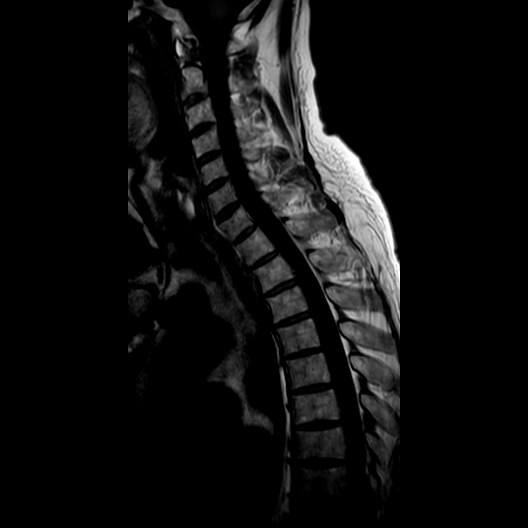
[im 15/15]
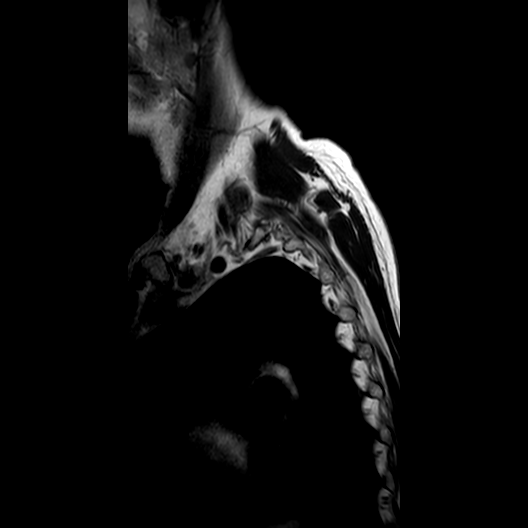

[Series 302: T1 · sagittal · 5.5mm · 0.66mm/px · 3 of 15 slices shown (2 of 3)]
[im 1/15]
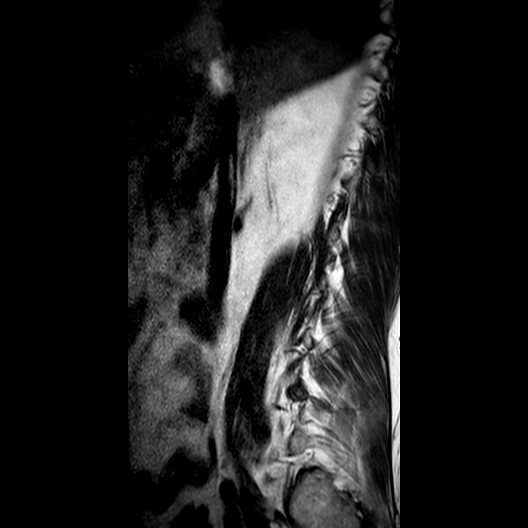
[im 8/15]
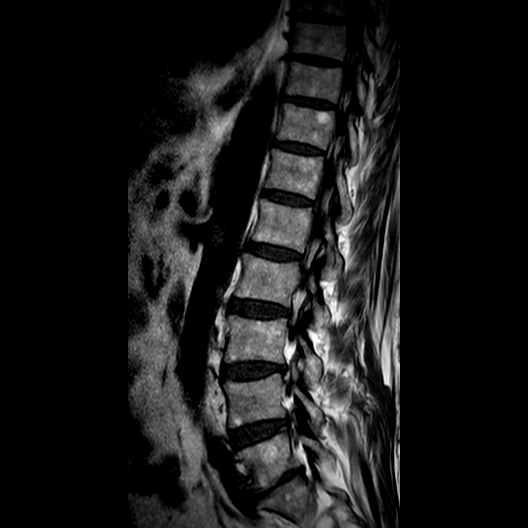
[im 15/15]
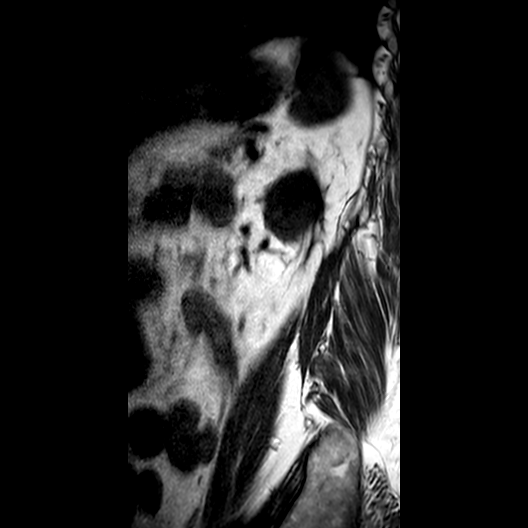

[Series 303: T1 · sagittal · 5.5mm · 0.66mm/px · 3 of 15 slices shown (3 of 3)]
[im 1/15]
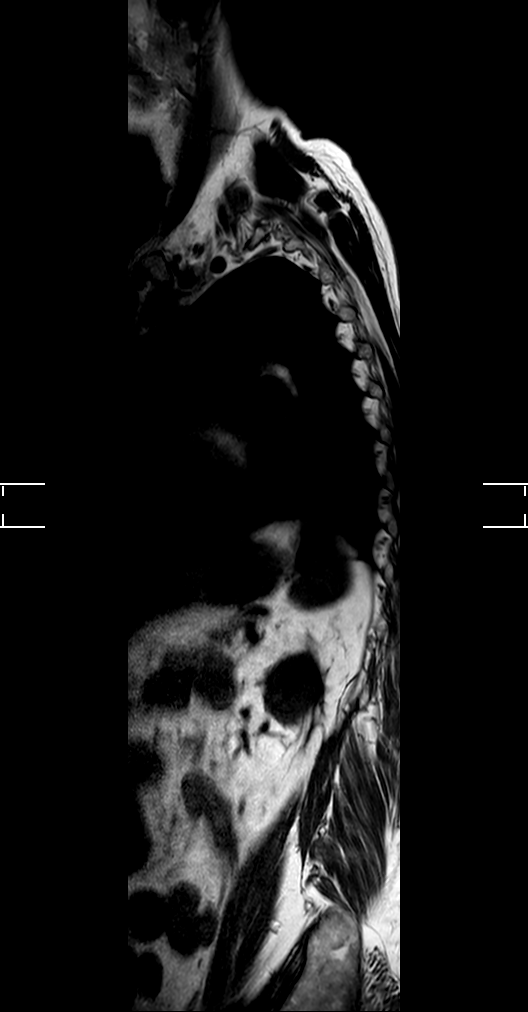
[im 8/15]
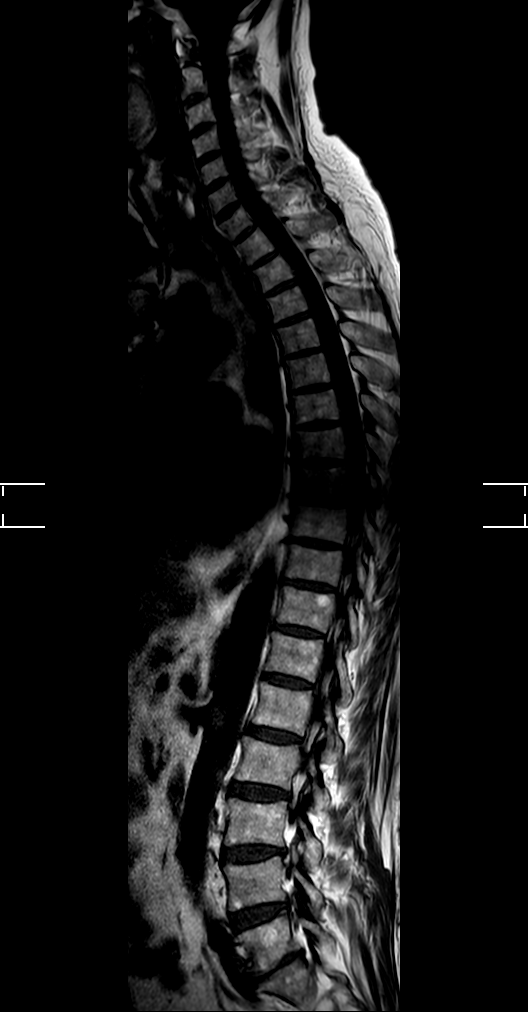
[im 15/15]
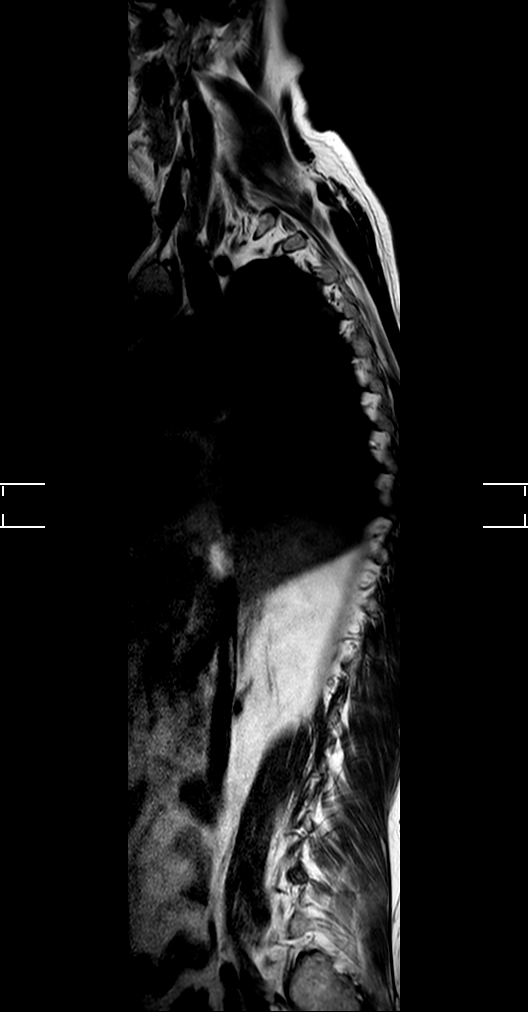

[Series 401: t1_tse_sag · sagittal · 3.0mm · 0.48mm/px · 1 of 21 slices shown]
[im 1/21]
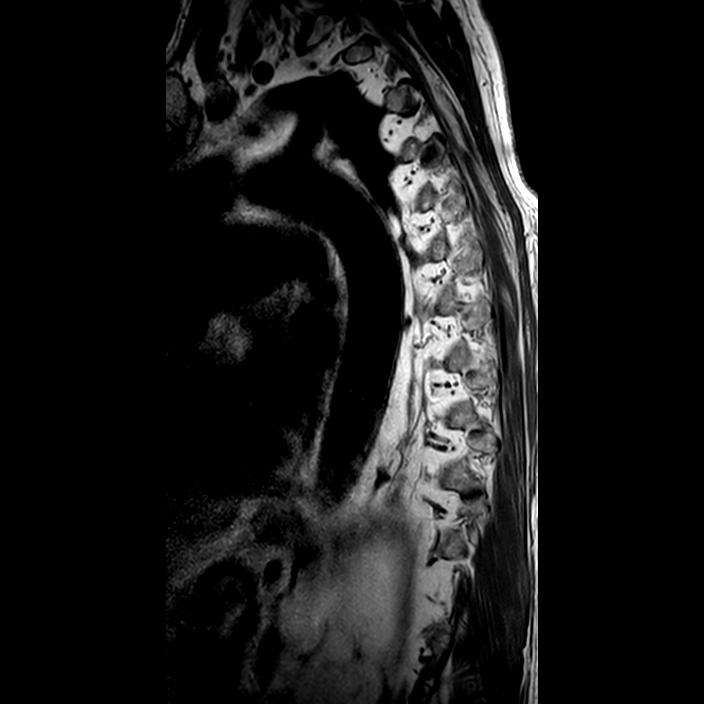

[17 of 48 positions shown; findings below may reference images not displayed]

FINDINGS: VERTEBRA: The vertebral bodies are normal in height and alignment. No acute 
vertebral body fracture. Multifocal degenerative change, most marked at L5-S1 5 
mm retrolisthesis at L5-S1. No marrow replacing lesions.  
SPINAL CORD: Flattening of the ventral cord at the level of the T2-3 and T7-T8 
disc protrusions. Spinal cord is otherwise normal in caliber and signal 
intensity.  Conus medullaris is at the level of L1.  
THORACIC DISCS: Multilevel degenerative disc disease. T2-3 left paracentral disc 
protrusion flattens the left ventral cord. T7-8 small central disc protrusion 
flattens the ventral cord. T10-T11 tiny right paracentral disc protrusion. 
Multifocal thoracic disc bulges. No thoracic central canal stenosis. Mild 
narrowing of several upper thoracic neural foramina. 
Modic I-II: None. 
Ligamentum Flavum > 2.5 mm: All lumbar levels 
T12-L1: The disc is normal in height and signal. No disc herniation. Normal 
facets. No spinal canal or neural foraminal stenosis. 
L1-L2: The disc is normal in height and signal. No disc herniation. Normal 
facets. No spinal canal or neural foraminal stenosis. 
L2-L3: The disc is normal in height and signal. No disc herniation. Mild facet 
arthropathy. No spinal canal or neural foraminal stenosis. 
L3-L4: The disc is normal in height with disc desiccation. No disc herniation. 
Mild facet arthropathy and mild prominence of the dorsal epidural fat. Mild 
central canal/lateral recess stenosis. No neural foraminal stenosis. 
L4-L5: Mild disc bulge. The disc is normal in height with disc desiccation. No 
disc herniation. Mild facet arthropathy. Mild prominence of the dorsal epidural. 
No central canal stenosis. Mild lateral recess/left neural foraminal stenosis. 
L5-S1: Osteophyte-disc complex, with central posterior predominance. Mild facet 
arthropathy. No spinal canal or neural foraminal stenosis. 
OTHER: Localizer view demonstrates degenerative change of the cervical spine. 
The aorta is normal in diameter. The posterior paraspinal soft tissues are 
negative. Renal cysts.
IMPRESSION: 1.  Multifocal degenerative change. 
2.  Flattening of the ventral cord at the level of the T2-3 and T7-T8 disc 
protrusions. T10-T11 tiny right paracentral disc protrusion and mild narrowing 
of several upper thoracic neural foramina. 
3.  L3-L4 mild central canal/lateral recess stenosis.  
4.  L4-L5 mild lateral recess/left neural foraminal stenosis. 
5.  L5-S1 osteophyte-disc complex, with central posterior predominance.
